# Patient Record
Sex: Female | Born: 1985 | Race: White | Hispanic: No | Marital: Single | Smoking: Former smoker
Health system: Southern US, Community
[De-identification: ages and names within clinical notes are randomized; demographics above are authoritative.]

## PROBLEM LIST (undated history)

## (undated) ENCOUNTER — Inpatient Hospital Stay (HOSPITAL_COMMUNITY): Payer: Self-pay

## (undated) DIAGNOSIS — N83209 Unspecified ovarian cyst, unspecified side: Secondary | ICD-10-CM

## (undated) DIAGNOSIS — N200 Calculus of kidney: Secondary | ICD-10-CM

## (undated) DIAGNOSIS — N390 Urinary tract infection, site not specified: Secondary | ICD-10-CM

## (undated) DIAGNOSIS — F419 Anxiety disorder, unspecified: Secondary | ICD-10-CM

## (undated) HISTORY — PX: NO PAST SURGERIES: SHX2092

## (undated) HISTORY — PX: TYMPANOSTOMY TUBE PLACEMENT: SHX32

---

## 2006-12-08 ENCOUNTER — Inpatient Hospital Stay (HOSPITAL_COMMUNITY): Admission: AD | Admit: 2006-12-08 | Discharge: 2006-12-08 | Payer: Self-pay | Admitting: Obstetrics and Gynecology

## 2007-05-22 ENCOUNTER — Inpatient Hospital Stay (HOSPITAL_COMMUNITY): Admission: AD | Admit: 2007-05-22 | Discharge: 2007-05-24 | Payer: Self-pay | Admitting: Obstetrics and Gynecology

## 2007-05-22 ENCOUNTER — Inpatient Hospital Stay (HOSPITAL_COMMUNITY): Admission: AD | Admit: 2007-05-22 | Discharge: 2007-05-22 | Payer: Self-pay | Admitting: Obstetrics and Gynecology

## 2009-02-16 ENCOUNTER — Emergency Department (HOSPITAL_BASED_OUTPATIENT_CLINIC_OR_DEPARTMENT_OTHER): Admission: EM | Admit: 2009-02-16 | Discharge: 2009-02-16 | Payer: Self-pay | Admitting: Emergency Medicine

## 2011-03-28 ENCOUNTER — Emergency Department (HOSPITAL_BASED_OUTPATIENT_CLINIC_OR_DEPARTMENT_OTHER)
Admission: EM | Admit: 2011-03-28 | Discharge: 2011-03-28 | Disposition: A | Discharge status: Home / Self Care | Payer: Medicaid Other | Attending: Emergency Medicine | Admitting: Emergency Medicine

## 2011-03-28 DIAGNOSIS — S335XXA Sprain of ligaments of lumbar spine, initial encounter: Secondary | ICD-10-CM | POA: Insufficient documentation

## 2011-03-28 DIAGNOSIS — X500XXA Overexertion from strenuous movement or load, initial encounter: Secondary | ICD-10-CM | POA: Insufficient documentation

## 2011-03-28 DIAGNOSIS — F17200 Nicotine dependence, unspecified, uncomplicated: Secondary | ICD-10-CM | POA: Insufficient documentation

## 2011-03-28 LAB — URINALYSIS, ROUTINE W REFLEX MICROSCOPIC
Glucose, UA: NEGATIVE mg/dL
Ketones, ur: NEGATIVE mg/dL
Protein, ur: NEGATIVE mg/dL
Specific Gravity, Urine: 1.009 (ref 1.005–1.030)
Urobilinogen, UA: 0.2 mg/dL (ref 0.0–1.0)
pH: 6.5 (ref 5.0–8.0)

## 2011-04-07 NOTE — Discharge Summary (Signed)
NAMEIRAN, KIEVIT NO.:  [id number]   MEDICAL RECORD NO.:  [id number]          PATIENT TYPE:  INP   LOCATION:  9108                          FACILITY:  [facility]   PHYSICIAN:  Huel Cote, M.D. DATE OF BIRTH:  Apr 09, 1986   DATE OF ADMISSION:  05/22/2007  DATE OF DISCHARGE:  05/24/2007                               DISCHARGE SUMMARY   DISCHARGE DIAGNOSES:  1. Term pregnancy at 38+ weeks, delivered.  2. Status post normal spontaneous vaginal delivery.   DISCHARGE MEDICATIONS:  1. Motrin 600 mg p.o. q.6h.  2. Percocet 1-2 tabs p.o. q.4h. p.r.n.   DISCHARGE FOLLOWUP:  The patient is to follow up in my office in 6 weeks  for a routine postpartum exam.   HOSPITAL COURSE:  The patient is a 25 year old G1 P0, who was admitted  at 38+ weeks gestation in active labor with cervical change to 4-5 cm.  Her prenatal care had been complicated by a history of migraines and  panic attacks, but that have been stable throughout the pregnancy.   PRENATAL LABS:  A positive, antibody negative, RPR nonreactive, rubella  immune, hepatitis B surface antigen negative, HIV declined, GC negative,  Chlamydia negative, quad screen negative, 1 hour Glucola 108.   PAST OB HISTORY:  None.   PAST GYN HISTORY:  No abnormal Pap smears.   PAST MEDICAL HISTORY:  Panic attacks and migraines as stated.   PAST SURGICAL HISTORY:  None.   No known drug allergies.   On admission, she was afebrile with stable vital signs.  Fetal heart  rate was reactive.  Cervix was 80 and 5-6 and -1 station.  She had  rupture of membranes performed with clear fluid noted.  Approximately 2  hours thereafter, she really had not changed much and therefore was  augmented with Pitocin.  She progressed then quickly to complete  dilation and pushed well for approximately 45 minutes with a normal  spontaneous vaginal delivery of a vigorous female infant over an intact  perineum.  Apgars were 8 and 9.  Weight was 6  pounds 15 ounces.  There  was nuchal cord x1 delivered through, placenta delivered spontaneously.  Cervix, rectum, and vagina were intact, and estimated blood loss was 350  mL.  She was then admitted for routine postpartum care.  On postpartum  day #1, she was doing  quite well.  Her pain was well-controlled with Motrin and Percocet.  Her  bleeding was normal, and she requested early discharge home.  This was  granted as long as the baby was able to be discharged, and discharge  instructions were reviewed with her, and she was instructed to follow up  in 6 weeks.      Huel Cote, M.D.  Electronically Signed     KR/MEDQ  D:  05/24/2007  T:  05/24/2007  Job:  [id number]

## 2011-04-10 ENCOUNTER — Emergency Department (INDEPENDENT_AMBULATORY_CARE_PROVIDER_SITE_OTHER): Payer: Medicaid Other

## 2011-04-10 ENCOUNTER — Emergency Department (HOSPITAL_BASED_OUTPATIENT_CLINIC_OR_DEPARTMENT_OTHER)
Admission: EM | Admit: 2011-04-10 | Discharge: 2011-04-10 | Disposition: A | Discharge status: Home / Self Care | Payer: Medicaid Other | Attending: Emergency Medicine | Admitting: Emergency Medicine

## 2011-04-10 DIAGNOSIS — R109 Unspecified abdominal pain: Secondary | ICD-10-CM

## 2011-04-10 DIAGNOSIS — K5900 Constipation, unspecified: Secondary | ICD-10-CM | POA: Insufficient documentation

## 2011-04-10 DIAGNOSIS — Z87442 Personal history of urinary calculi: Secondary | ICD-10-CM

## 2011-04-10 LAB — URINALYSIS, ROUTINE W REFLEX MICROSCOPIC
Glucose, UA: NEGATIVE mg/dL
Nitrite: NEGATIVE
Protein, ur: NEGATIVE mg/dL
Urobilinogen, UA: 0.2 mg/dL (ref 0.0–1.0)

## 2011-04-10 LAB — DIFFERENTIAL
Lymphocytes Relative: 36 % (ref 12–46)
Lymphs Abs: 2.2 10*3/uL (ref 0.7–4.0)
Monocytes Relative: 9 % (ref 3–12)
Neutro Abs: 3.2 10*3/uL (ref 1.7–7.7)
Neutrophils Relative %: 53 % (ref 43–77)

## 2011-04-10 LAB — COMPREHENSIVE METABOLIC PANEL
ALT: 14 U/L (ref 0–35)
Albumin: 4.4 g/dL (ref 3.5–5.2)
Alkaline Phosphatase: 50 U/L (ref 39–117)
Calcium: 9.7 mg/dL (ref 8.4–10.5)
GFR calc non Af Amer: >60 mL/min (ref >60)
Sodium: 137 mEq/L (ref 135–145)
Total Bilirubin: 1.6 mg/dL — ABNORMAL HIGH (ref 0.3–1.2)
Total Protein: 7.3 g/dL (ref 6.0–8.3)

## 2011-04-10 LAB — CBC
HCT: 37.7 % (ref 36.0–46.0)
MCH: 29.9 pg (ref 26.0–34.0)
MCV: 86.1 fL (ref 78.0–100.0)
RBC: 4.38 MIL/uL (ref 3.87–5.11)

## 2011-09-08 LAB — CBC
Hemoglobin: 9.8 — ABNORMAL LOW
MCHC: 33.6
MCV: 85.2
RBC: 3.41 — ABNORMAL LOW
WBC: 8.8

## 2011-09-09 LAB — RAPID HIV SCREEN (WH-MAU): Rapid HIV Screen: NONREACTIVE

## 2011-09-09 LAB — CBC
Hemoglobin: 11.6 — ABNORMAL LOW
MCHC: 33.5
MCV: 84.2
Platelets: 217

## 2011-12-15 ENCOUNTER — Emergency Department (HOSPITAL_BASED_OUTPATIENT_CLINIC_OR_DEPARTMENT_OTHER)
Admission: EM | Admit: 2011-12-15 | Discharge: 2011-12-15 | Disposition: A | Discharge status: Home / Self Care | Payer: Medicaid Other | Attending: Emergency Medicine | Admitting: Emergency Medicine

## 2011-12-15 ENCOUNTER — Encounter (HOSPITAL_BASED_OUTPATIENT_CLINIC_OR_DEPARTMENT_OTHER): Payer: Self-pay | Admitting: *Deleted

## 2011-12-15 DIAGNOSIS — R05 Cough: Secondary | ICD-10-CM | POA: Insufficient documentation

## 2011-12-15 DIAGNOSIS — J069 Acute upper respiratory infection, unspecified: Secondary | ICD-10-CM | POA: Insufficient documentation

## 2011-12-15 DIAGNOSIS — J029 Acute pharyngitis, unspecified: Secondary | ICD-10-CM

## 2011-12-15 DIAGNOSIS — R059 Cough, unspecified: Secondary | ICD-10-CM | POA: Insufficient documentation

## 2011-12-15 HISTORY — DX: Unspecified ovarian cyst, unspecified side: N83.209

## 2011-12-15 HISTORY — DX: Anxiety disorder, unspecified: F41.9

## 2011-12-15 HISTORY — DX: Urinary tract infection, site not specified: N39.0

## 2011-12-15 MED ORDER — DEXAMETHASONE SODIUM PHOSPHATE 10 MG/ML IJ SOLN
INTRAMUSCULAR | Status: AC
  Filled 2011-12-15: qty 1

## 2011-12-15 MED ORDER — NAPROXEN 500 MG PO TABS: 500.0000 mg | ORAL_TABLET | Freq: Two times a day (BID) | ORAL | Status: AC

## 2011-12-15 MED ORDER — DEXAMETHASONE 10 MG/ML FOR PEDIATRIC ORAL USE
10.0000 mg | Freq: Once | INTRAMUSCULAR | Status: AC
  Administered 2011-12-15: 10 mg via ORAL
  Filled 2011-12-15: qty 1

## 2011-12-15 NOTE — ED Notes (Signed)
Patient states she has had a sore throat for the last 5 days, which is associated with chills, hot flashes, fatigue and a nonproductive cough.

## 2011-12-15 NOTE — ED Provider Notes (Signed)
History     CSN: [id number]  Arrival date & time 12/15/11  4098   First MD Initiated Contact with Patient 12/15/11 1027      Chief Complaint  Patient presents with  . Sore Throat    (Consider location/radiation/quality/duration/timing/severity/associated sxs/prior treatment) Patient is a 26 y.o. female presenting with pharyngitis. The history is provided by the patient. No language interpreter was used.  Sore Throat This is a new problem. The current episode started more than 2 days ago (5 days ago). The problem occurs constantly. The problem has been gradually worsening. Pertinent negatives include no chest pain, no abdominal pain, no headaches and no shortness of breath. The symptoms are aggravated by swallowing. The symptoms are relieved by nothing. She has tried nothing for the symptoms.    Past Medical History  Diagnosis Date  . Anxiety   . Ovarian cyst   . UTI (lower urinary tract infection)     History reviewed. No pertinent past surgical history.  No family history on file.  History  Substance Use Topics  . Smoking status: Former Games developer  . Smokeless tobacco: Never Used  . Alcohol Use: Yes     occassionally    OB History    Grav Para Term Preterm Abortions TAB SAB Ect Mult Living                  Review of Systems  Constitutional: Negative for chills, activity change, appetite change and fatigue.  HENT: Positive for congestion, sore throat and rhinorrhea. Negative for trouble swallowing, neck pain and neck stiffness.   Respiratory: Positive for cough. Negative for shortness of breath.   Cardiovascular: Negative for chest pain and palpitations.  Gastrointestinal: Negative for nausea, vomiting and abdominal pain.  Genitourinary: Negative for dysuria, urgency, frequency and flank pain.  Neurological: Negative for dizziness, weakness, light-headedness, numbness and headaches.  All other systems reviewed and are negative.    Allergies  Review of patient's  allergies indicates no known allergies.  Home Medications   Current Outpatient Rx  Name Route Sig Dispense Refill  . NAPROXEN 500 MG PO TABS Oral Take 1 tablet (500 mg total) by mouth 2 (two) times daily. 30 tablet 0    BP 109/66  Pulse 77  Temp(Src) 98.6 F (37 C) (Oral)  Resp 18  Ht 5\' 6"  (1.676 m)  Wt 118 lb (53.524 kg)  BMI 19.05 kg/m2  SpO2 100%  LMP 12/01/2011  Physical Exam  Nursing note and vitals reviewed. Constitutional: She appears well-developed and well-nourished. No distress.  HENT:  Head: Normocephalic and atraumatic.  Mouth/Throat: No oropharyngeal exudate.       Mild oropharyngeal erythema  Eyes: Conjunctivae and EOM are normal. Pupils are equal, round, and reactive to light.  Neck: Normal range of motion. Neck supple.  Cardiovascular: Normal rate, regular rhythm, normal heart sounds and intact distal pulses.  Exam reveals no gallop and no friction rub.   No murmur heard. Pulmonary/Chest: Effort normal and breath sounds normal. No respiratory distress.  Abdominal: Soft. Bowel sounds are normal. There is no tenderness.  Musculoskeletal: Normal range of motion. She exhibits no tenderness.  Lymphadenopathy:    She has no cervical adenopathy.  Neurological: She is alert. No cranial nerve deficit.  Skin: Skin is warm and dry. No rash noted.    ED Course  Procedures (including critical care time)   Labs Reviewed  RAPID STREP SCREEN   No results found.   1. URI (upper respiratory infection)   2.  Pharyngitis       MDM  Rapid strep negative. I feel her pharyngitis is secondary to her upper respiratory infection. She was given a dose of Decadron the emergency department. I encouraged to the symptoms are noted throughout its course it is secondary to viral illness. She's instructed to followup with her primary care physician as needed        Dayton Bailiff, MD 12/15/11 1048

## 2012-04-02 ENCOUNTER — Encounter (HOSPITAL_BASED_OUTPATIENT_CLINIC_OR_DEPARTMENT_OTHER): Payer: Self-pay | Admitting: *Deleted

## 2012-04-02 ENCOUNTER — Emergency Department (HOSPITAL_BASED_OUTPATIENT_CLINIC_OR_DEPARTMENT_OTHER)
Admission: EM | Admit: 2012-04-02 | Discharge: 2012-04-02 | Disposition: A | Discharge status: Home / Self Care | Payer: Medicaid Other | Attending: Emergency Medicine | Admitting: Emergency Medicine

## 2012-04-02 ENCOUNTER — Emergency Department (INDEPENDENT_AMBULATORY_CARE_PROVIDER_SITE_OTHER): Payer: Medicaid Other

## 2012-04-02 DIAGNOSIS — F411 Generalized anxiety disorder: Secondary | ICD-10-CM

## 2012-04-02 DIAGNOSIS — R42 Dizziness and giddiness: Secondary | ICD-10-CM | POA: Insufficient documentation

## 2012-04-02 DIAGNOSIS — R51 Headache: Secondary | ICD-10-CM

## 2012-04-02 LAB — URINALYSIS, ROUTINE W REFLEX MICROSCOPIC
Glucose, UA: NEGATIVE mg/dL
Ketones, ur: NEGATIVE mg/dL
Nitrite: NEGATIVE
pH: 6.5 (ref 5.0–8.0)

## 2012-04-02 LAB — DIFFERENTIAL
Basophils Absolute: 0 10*3/uL (ref 0.0–0.1)
Basophils Relative: 0 % (ref 0–1)
Eosinophils Relative: 2 % (ref 0–5)
Monocytes Absolute: 0.5 10*3/uL (ref 0.1–1.0)
Monocytes Relative: 9 % (ref 3–12)

## 2012-04-02 LAB — CBC
MCHC: 35 g/dL (ref 30.0–36.0)
Platelets: 230 10*3/uL (ref 150–400)
RDW: 11.8 % (ref 11.5–15.5)

## 2012-04-02 LAB — BASIC METABOLIC PANEL
BUN: 16 mg/dL (ref 6–23)
CO2: 29 mEq/L (ref 19–32)
Calcium: 9.8 mg/dL (ref 8.4–10.5)
Creatinine, Ser: 0.8 mg/dL (ref 0.50–1.10)
GFR calc Af Amer: >90 mL/min (ref >90)

## 2012-04-02 LAB — URINE MICROSCOPIC-ADD ON

## 2012-04-02 MED ORDER — SODIUM CHLORIDE 0.9 % IV BOLUS (SEPSIS)
1000.0000 mL | Freq: Once | INTRAVENOUS | Status: AC
  Administered 2012-04-02: 1000 mL via INTRAVENOUS

## 2012-04-02 MED ORDER — DIPHENHYDRAMINE HCL 50 MG/ML IJ SOLN
25.0000 mg | Freq: Once | INTRAMUSCULAR | Status: AC
  Administered 2012-04-02: 25 mg via INTRAVENOUS
  Filled 2012-04-02: qty 1

## 2012-04-02 MED ORDER — KETOROLAC TROMETHAMINE 30 MG/ML IJ SOLN
30.0000 mg | Freq: Once | INTRAMUSCULAR | Status: AC
  Administered 2012-04-02: 30 mg via INTRAVENOUS
  Filled 2012-04-02: qty 1

## 2012-04-02 MED ORDER — PROCHLORPERAZINE EDISYLATE 5 MG/ML IJ SOLN
10.0000 mg | Freq: Four times a day (QID) | INTRAMUSCULAR | Status: DC | PRN
Start: 2012-04-02 — End: 2012-04-02
  Filled 2012-04-02: qty 2

## 2012-04-02 MED ORDER — METHYLPREDNISOLONE SODIUM SUCC 125 MG IJ SOLR
125.0000 mg | Freq: Once | INTRAMUSCULAR | Status: AC
  Administered 2012-04-02: 125 mg via INTRAVENOUS
  Filled 2012-04-02: qty 2

## 2012-04-02 NOTE — ED Provider Notes (Signed)
History     CSN: [id number]  Arrival date & time 04/02/12  1410   First MD Initiated Contact with Patient 04/02/12 1509      Chief Complaint  Patient presents with  . Headache    (Consider location/radiation/quality/duration/timing/severity/associated sxs/prior treatment) Patient is a 26 y.o. female presenting with headaches. The history is provided by the patient. No language interpreter was used.  Headache    Kelsey Day is a 26 y.o. female who presents to the Emergency Department complaining of a moderate to severe, constant HA onset 2-3 days ago associated with a syncopic episode and lightheadedness. Patient claims that she gets migraines regularly that usually begin gradually. She has never been treated for them. Patient says that she usually feels pain in the occipital region of the head. Patient says the pain worsens when lights are too bright. Patient describes the headaches as sharp. Patient reports that she gets them 2-3 times per month and uses Goody's powder with no relief. Patient also took an Ibuprofen today. Patient has a current Mirena prescription. Patient denies hematuria, dysuria or excessive urination. Patient has no history of other chronic illness and doesn't take any regular medications.    Past Medical History  Diagnosis Date  . Anxiety   . Ovarian cyst   . UTI (lower urinary tract infection)     History reviewed. No pertinent past surgical history.  History reviewed. No pertinent family history.  History  Substance Use Topics  . Smoking status: Former Games developer  . Smokeless tobacco: Never Used  . Alcohol Use: Yes     occassionally    OB History    Grav Para Term Preterm Abortions TAB SAB Ect Mult Living                  Review of Systems  Neurological: Positive for headaches.  A complete 10 system review of systems was obtained and all systems are negative except as noted in the HPI, ROS and PMH.    Allergies  Review of patient's  allergies indicates no known allergies.  Home Medications   Current Outpatient Rx  Name Route Sig Dispense Refill  . ALPRAZOLAM 0.25 MG PO TABS Oral Take 0.25 mg by mouth at bedtime as needed.    Marlin Canary HEADACHE PO Oral Take 1 packet by mouth daily as needed. Patient used this medication for her headache.    Marland Kitchen NAPROXEN 500 MG PO TABS Oral Take 1 tablet (500 mg total) by mouth 2 (two) times daily. 30 tablet 0    BP 104/62  Pulse 78  Temp(Src) 98.2 F (36.8 C) (Oral)  Resp 20  Ht 5\' 2"  (1.575 m)  Wt 125 lb (56.7 kg)  BMI 22.86 kg/m2  SpO2 98%  LMP 03/03/2012  Physical Exam  Nursing note and vitals reviewed. Constitutional: She is oriented to person, place, and time. She appears well-developed and well-nourished.  HENT:  Head: Normocephalic and atraumatic.  Eyes: Conjunctivae and EOM are normal. Pupils are equal, round, and reactive to light.  Neck: Normal range of motion. Neck supple.  Cardiovascular: Normal rate and regular rhythm.   Pulmonary/Chest: Effort normal and breath sounds normal.  Abdominal: Soft. Bowel sounds are normal.  Musculoskeletal: Normal range of motion.  Neurological: She is alert and oriented to person, place, and time.  Skin: Skin is warm and dry.  Psychiatric: She has a normal mood and affect.    ED Course  Procedures (including critical care time) DIAGNOSTIC STUDIES: Oxygen Saturation is  98% on room air, normal by my interpretation.    COORDINATION OF CARE: 3:34PM- Patient informed of current plan for treatment and evaluation and agrees with plan at this time. Will order labs and administer pain medications for HA.   Labs Reviewed  URINALYSIS, ROUTINE W REFLEX MICROSCOPIC - Abnormal; Notable for the following:    Leukocytes, UA TRACE (*)    All other components within normal limits  URINE MICROSCOPIC-ADD ON - Abnormal; Notable for the following:    Squamous Epithelial / LPF FEW (*)    Bacteria, UA MANY (*)    All other components within  normal limits  PREGNANCY, URINE  CBC  DIFFERENTIAL  BASIC METABOLIC PANEL   Ct Head Wo Contrast  04/02/2012  *RADIOLOGY REPORT*  Clinical Data: Headache, anxiety  CT HEAD WITHOUT CONTRAST  Technique:  Contiguous axial images were obtained from the base of the skull through the vertex without contrast.  Comparison: None.  Findings: No acute intracranial hemorrhage.  No focal mass lesion. No CT evidence of acute infarction.   No midline shift or mass effect.  No hydrocephalus.  Basilar cisterns are patent. Paranasal sinuses and mastoid air cells are clear.  Orbits are normal.  IMPRESSION: Normal head CT  Original Report Authenticated By: Genevive Bi, M.D.     No diagnosis found.    MDM  Patient with headache c.w. Migrain.  Patient treated here with benadryl, solumedrol, and toradol with improvement of symptoms.  CT without acute abnormality. Urine with bacteria and le and epithelial cells - likely contaminated as no uti symptoms.   I personally performed the services described in this documentation, which was scribed in my presence. The recorded information has been reviewed and considered.       Hilario Quarry, MD 04/02/12 [phone]

## 2012-04-02 NOTE — ED Notes (Signed)
Pt states son recently diagnosed with a ":virus". Similar symptoms. Pt describes 2 days of feeling light-headed. Near syncope yesterday at work. Feel faint. Also c/o abd pain and H/A. PERL. Ambulatory without difficulty. Husband reports feeling light-headed as well.

## 2012-04-02 NOTE — Discharge Instructions (Signed)

## 2012-12-27 ENCOUNTER — Emergency Department (HOSPITAL_BASED_OUTPATIENT_CLINIC_OR_DEPARTMENT_OTHER)
Admission: EM | Admit: 2012-12-27 | Discharge: 2012-12-27 | Disposition: A | Discharge status: Home / Self Care | Payer: Medicaid Other | Attending: Emergency Medicine | Admitting: Emergency Medicine

## 2012-12-27 ENCOUNTER — Emergency Department (HOSPITAL_BASED_OUTPATIENT_CLINIC_OR_DEPARTMENT_OTHER): Payer: Medicaid Other

## 2012-12-27 ENCOUNTER — Encounter (HOSPITAL_BASED_OUTPATIENT_CLINIC_OR_DEPARTMENT_OTHER): Payer: Self-pay

## 2012-12-27 ENCOUNTER — Emergency Department (HOSPITAL_BASED_OUTPATIENT_CLINIC_OR_DEPARTMENT_OTHER): Admission: EM | Admit: 2012-12-27 | Payer: Medicaid Other | Source: Home / Self Care

## 2012-12-27 DIAGNOSIS — Z8742 Personal history of other diseases of the female genital tract: Secondary | ICD-10-CM | POA: Insufficient documentation

## 2012-12-27 DIAGNOSIS — Z3202 Encounter for pregnancy test, result negative: Secondary | ICD-10-CM | POA: Insufficient documentation

## 2012-12-27 DIAGNOSIS — M549 Dorsalgia, unspecified: Secondary | ICD-10-CM | POA: Insufficient documentation

## 2012-12-27 DIAGNOSIS — R109 Unspecified abdominal pain: Secondary | ICD-10-CM | POA: Insufficient documentation

## 2012-12-27 DIAGNOSIS — Z8659 Personal history of other mental and behavioral disorders: Secondary | ICD-10-CM | POA: Insufficient documentation

## 2012-12-27 DIAGNOSIS — Z87440 Personal history of urinary (tract) infections: Secondary | ICD-10-CM | POA: Insufficient documentation

## 2012-12-27 LAB — CBC WITH DIFFERENTIAL/PLATELET
Eosinophils Relative: 3 % (ref 0–5)
Hemoglobin: 13.8 g/dL (ref 12.0–15.0)
Lymphocytes Relative: 40 % (ref 12–46)
Lymphs Abs: 2 10*3/uL (ref 0.7–4.0)
MCV: 88.1 fL (ref 78.0–100.0)
Platelets: 220 10*3/uL (ref 150–400)
RBC: 4.52 MIL/uL (ref 3.87–5.11)
WBC: 4.9 10*3/uL (ref 4.0–10.5)

## 2012-12-27 LAB — COMPREHENSIVE METABOLIC PANEL
ALT: 22 U/L (ref 0–35)
AST: 16 U/L (ref 0–37)
Albumin: 3.9 g/dL (ref 3.5–5.2)
Calcium: 9.6 mg/dL (ref 8.4–10.5)
Sodium: 140 mEq/L (ref 135–145)
Total Protein: 7 g/dL (ref 6.0–8.3)

## 2012-12-27 LAB — URINALYSIS, ROUTINE W REFLEX MICROSCOPIC
Glucose, UA: NEGATIVE mg/dL
Specific Gravity, Urine: 1.027 (ref 1.005–1.030)
Urobilinogen, UA: 1 mg/dL (ref 0.0–1.0)
pH: 6.5 (ref 5.0–8.0)

## 2012-12-27 LAB — PREGNANCY, URINE: Preg Test, Ur: NEGATIVE

## 2012-12-27 LAB — URINE MICROSCOPIC-ADD ON

## 2012-12-27 MED ORDER — ONDANSETRON HCL 4 MG/2ML IJ SOLN
4.0000 mg | Freq: Once | INTRAMUSCULAR | Status: AC
  Administered 2012-12-27: 4 mg via INTRAVENOUS
  Filled 2012-12-27: qty 2

## 2012-12-27 MED ORDER — HYDROMORPHONE HCL PF 1 MG/ML IJ SOLN
1.0000 mg | Freq: Once | INTRAMUSCULAR | Status: AC
  Administered 2012-12-27: 1 mg via INTRAVENOUS
  Filled 2012-12-27: qty 1

## 2012-12-27 MED ORDER — SODIUM CHLORIDE 0.9 % IV SOLN
1000.0000 mL | INTRAVENOUS | Status: DC
  Administered 2012-12-27: 1000 mL via INTRAVENOUS

## 2012-12-27 MED ORDER — HYDROCODONE-ACETAMINOPHEN 5-325 MG PO TABS: 1.0000 | ORAL_TABLET | Freq: Four times a day (QID) | ORAL | Status: DC | PRN

## 2012-12-27 MED ORDER — SODIUM CHLORIDE 0.9 % IV SOLN
1000.0000 mL | Freq: Once | INTRAVENOUS | Status: AC
  Administered 2012-12-27: 1000 mL via INTRAVENOUS

## 2012-12-27 MED ORDER — NAPROXEN 500 MG PO TABS: 500.0000 mg | ORAL_TABLET | Freq: Two times a day (BID) | ORAL | Status: DC

## 2012-12-27 NOTE — ED Provider Notes (Signed)
History    CSN: [id number] Arrival date & time 12/27/12  1513First MD Initiated Contact with Patient 12/27/12 1540      Chief Complaint  Patient presents with  . Abdominal Pain    Patient is a 27 y.o. female presenting with abdominal pain. The history is provided by the patient.  Abdominal Pain The primary symptoms of the illness include abdominal pain. The primary symptoms of the illness do not include fever, fatigue, vomiting, diarrhea, dysuria or vaginal discharge. Episode onset: pt had abominal pain starting last night, today she felt it get acutely mores severe, she thought something  burst when it acutely became more severe today.  The abdominal pain is located in the left flank. The abdominal pain does not radiate. The severity of the abdominal pain is 10/10. The abdominal pain is relieved by nothing.  The patient has not had a change in bowel habit. Additional symptoms associated with the illness include back pain. Symptoms associated with the illness do not include chills, anorexia, diaphoresis, constipation, urgency, hematuria or frequency.    History reviewed. No pertinent past medical history.  History reviewed. No pertinent past surgical history.  No family history on file.  History  Substance Use Topics  . Smoking status: Not on file  . Smokeless tobacco: Not on file  . Alcohol Use: Not on file    OB History    Grav Para Term Preterm Abortions TAB SAB Ect Mult Living                  Review of Systems  Constitutional: Negative for fever, chills, diaphoresis and fatigue.  Gastrointestinal: Positive for abdominal pain. Negative for vomiting, diarrhea, constipation and anorexia.  Genitourinary: Negative for dysuria, urgency, frequency, hematuria and vaginal discharge.  Musculoskeletal: Positive for back pain.  All other systems reviewed and are negative.    Allergies  Review of patient's allergies indicates no known allergies.  Home Medications  No current  outpatient prescriptions on file.  BP 120/84  Pulse 76  Temp 98 F (36.7 C) (Oral)  Resp 20  SpO2 96%  LMP 12/26/2012  Physical Exam  Nursing note and vitals reviewed. Constitutional: She appears well-developed and well-nourished. No distress.  HENT:  Head: Normocephalic and atraumatic.  Right Ear: External ear normal.  Left Ear: External ear normal.  Eyes: Conjunctivae normal are normal. Right eye exhibits no discharge. Left eye exhibits no discharge. No scleral icterus.  Neck: Neck supple. No tracheal deviation present.  Cardiovascular: Normal rate, regular rhythm and intact distal pulses.   Pulmonary/Chest: Effort normal and breath sounds normal. No stridor. No respiratory distress. She has no wheezes. She has no rales.  Abdominal: Soft. Bowel sounds are normal. She exhibits no distension. There is tenderness. There is CVA tenderness (left side). There is no rebound and no guarding. No hernia.  Musculoskeletal: She exhibits no edema and no tenderness.  Neurological: She is alert. She has normal strength. No sensory deficit. Cranial nerve deficit:  no gross defecits noted. She exhibits normal muscle tone. She displays no seizure activity. Coordination normal.  Skin: Skin is warm and dry. No rash noted.  Psychiatric: She has a normal mood and affect.    ED Course  Procedures (including critical care time)  Labs Reviewed  COMPREHENSIVE METABOLIC PANEL - Abnormal; Notable for the following:    Total Bilirubin 2.2 (*)     GFR calc non Af Amer 87 (*)     All other components within normal limits  URINALYSIS, ROUTINE  W REFLEX MICROSCOPIC - Abnormal; Notable for the following:    Color, Urine AMBER (*)  BIOCHEMICALS MAY BE AFFECTED BY COLOR   Hgb urine dipstick MODERATE (*)     All other components within normal limits  LIPASE, BLOOD  CBC WITH DIFFERENTIAL  PREGNANCY, URINE  URINE MICROSCOPIC-ADD ON   Ct Abdomen Pelvis Wo Contrast  12/27/2012  *RADIOLOGY REPORT*  Clinical  Data: Left flank pain and hematuria.  CT ABDOMEN AND PELVIS WITHOUT CONTRAST  Technique:  Multidetector CT imaging of the abdomen and pelvis was performed following the standard protocol without intravenous contrast.  Comparison: None  Findings: Lung bases are unremarkable in appearance.  Kidneys show no intrarenal calculi.  There is no hydronephrosis.  Although there are scattered phleboliths, no definite ureteral calculi identified.  No focal abnormality identified within the liver, spleen, pancreas, adrenal glands, or kidneys.  Gallbladder is present.  The stomach and small bowel loops are normal in appearance. The appendix is well seen and has a normal appearance.  Colonic loops are normal in appearance. The appendix is well seen and has a normal appearance.  The uterus is present.  No adnexal mass identified.  There is a small amount free pelvic fluid, likely physiologic. Visualized osseous structures have a normal appearance.  IMPRESSION: No evidence for acute abnormality of the abdomen and pelvis.   Original Report Authenticated By: Norva Pavlov, M.D.      1. Flank pain       MDM  Exam, findings, and CT can are reassuring. Pt with flank pain.  No sign of acute abnormality.  Possibly could be musculoskeletal.  At this time there does not appear to be any evidence of an acute emergency medical condition and the patient appears stable for discharge with appropriate outpatient follow up.         Celene Kras, MD 12/27/12 [phone]

## 2012-12-27 NOTE — ED Notes (Signed)
Pt reports onset of LUQ pain last night.

## 2012-12-29 ENCOUNTER — Encounter (HOSPITAL_BASED_OUTPATIENT_CLINIC_OR_DEPARTMENT_OTHER): Payer: Self-pay | Admitting: *Deleted

## 2013-04-01 ENCOUNTER — Inpatient Hospital Stay (HOSPITAL_COMMUNITY): Payer: Medicaid Other

## 2013-04-01 ENCOUNTER — Encounter (HOSPITAL_COMMUNITY): Payer: Self-pay | Admitting: *Deleted

## 2013-04-01 ENCOUNTER — Inpatient Hospital Stay (HOSPITAL_COMMUNITY)
Admission: AD | Admit: 2013-04-01 | Discharge: 2013-04-01 | Disposition: A | Discharge status: Home / Self Care | Payer: Medicaid Other | Source: Ambulatory Visit | Attending: Obstetrics & Gynecology | Admitting: Obstetrics & Gynecology

## 2013-04-01 DIAGNOSIS — O99891 Other specified diseases and conditions complicating pregnancy: Secondary | ICD-10-CM | POA: Insufficient documentation

## 2013-04-01 DIAGNOSIS — O9989 Other specified diseases and conditions complicating pregnancy, childbirth and the puerperium: Secondary | ICD-10-CM

## 2013-04-01 DIAGNOSIS — R109 Unspecified abdominal pain: Secondary | ICD-10-CM | POA: Insufficient documentation

## 2013-04-01 DIAGNOSIS — N949 Unspecified condition associated with female genital organs and menstrual cycle: Secondary | ICD-10-CM

## 2013-04-01 DIAGNOSIS — O26899 Other specified pregnancy related conditions, unspecified trimester: Secondary | ICD-10-CM

## 2013-04-01 LAB — URINALYSIS, ROUTINE W REFLEX MICROSCOPIC
Bilirubin Urine: NEGATIVE
Glucose, UA: NEGATIVE mg/dL
Ketones, ur: NEGATIVE mg/dL
Leukocytes, UA: NEGATIVE
Protein, ur: NEGATIVE mg/dL
pH: 6 (ref 5.0–8.0)

## 2013-04-01 LAB — WET PREP, GENITAL
Clue Cells Wet Prep HPF POC: NONE SEEN
Trich, Wet Prep: NONE SEEN

## 2013-04-01 NOTE — MAU Note (Signed)
Pt reports she has been having lower abd pain for several weeks. Has not started prenatal care awaiting medicaid approval.

## 2013-04-01 NOTE — MAU Provider Note (Signed)
  History     CSN: [id number]  Arrival date and time: 04/01/13 1238   First Provider Initiated Contact with Patient 04/01/13 1317      Chief Complaint  Patient presents with  . Abdominal Pain   HPI This is a 27 y.o. female at early gestation who presents with c/o lower abdominal cramping for 3 weeks. Unsure LMP.  No bleeding. Last baby 5 yrs ago.   RN Note Pt reports she has been having lower abd pain for several weeks. Has not started prenatal care awaiting medicaid approval  OB History   Grav Para Term Preterm Abortions TAB SAB Ect Mult Living   2 1 1       1       Past Medical History  Diagnosis Date  . Anxiety   . Ovarian cyst   . UTI (lower urinary tract infection)     Past Surgical History  Procedure Laterality Date  . No past surgeries      No family history on file.  History  Substance Use Topics  . Smoking status: Former Smoker    Quit date: 04/02/2007  . Smokeless tobacco: Not on file  . Alcohol Use: Yes     Comment: occassionally/not since pregnancy    Allergies: No Known Allergies  Prescriptions prior to admission  Medication Sig Dispense Refill  . Prenatal Vit-Fe Fumarate-FA (PRENATAL MULTIVITAMIN) TABS Take 1 tablet by mouth daily at 12 noon.        Review of Systems  Constitutional: Negative for fever and weight loss.  Gastrointestinal: Positive for abdominal pain. Negative for nausea, vomiting, diarrhea and constipation.  Neurological: Negative for dizziness, weakness and headaches.   Physical Exam   Blood pressure 100/65, pulse 106, temperature 98.2 F (36.8 C), temperature source Oral, resp. rate 18, height 5\' 6"  (1.676 m), weight 127 lb 3.2 oz (57.698 kg), last menstrual period 01/01/2013.  Physical Exam  Constitutional: She is oriented to person, place, and time. She appears well-developed and well-nourished.  HENT:  Head: Normocephalic.  Cardiovascular: Normal rate.   Respiratory: Effort normal.  GI: Soft.  Genitourinary:  Vagina normal and uterus normal. No vaginal discharge found.  Uterus 8-10 week size, nontender    Musculoskeletal: Normal range of motion.  Neurological: She is alert and oriented to person, place, and time.  Skin: Skin is warm and dry.  Psychiatric: She has a normal mood and affect.   UA and wet prep negative Cultures sent  MAU Course  Procedures  MDM Cultures done. Will check [place] for dates and r/o ovarian cyst   Assessment and Plan  A:  SIUP at unknown gestation per unsure LMP      Dates confirmed at 14.4 weeks      Pelvic pain, could be round ligament   P:  Reviewed results with patient.       Advised to start prenatal care soon   [facility] 04/01/2013, 1:29 PM

## 2013-04-26 IMAGING — CT CT HEAD W/O CM
2 series · 16 of 30 positions shown, 20 images · non-contrast
Comparison: None.

CLINICAL DATA: Headache, anxiety

CT HEAD WITHOUT CONTRAST
TECHNIQUE: Contiguous axial images were obtained from the base of
the skull through the vertex without contrast.

[Series 2: head 4.8 h37s · axial · 0.45mm/px · z∈[-114,+33]mm · 13 of 36 slices shown, 17 images]
[im 3/36  brain]
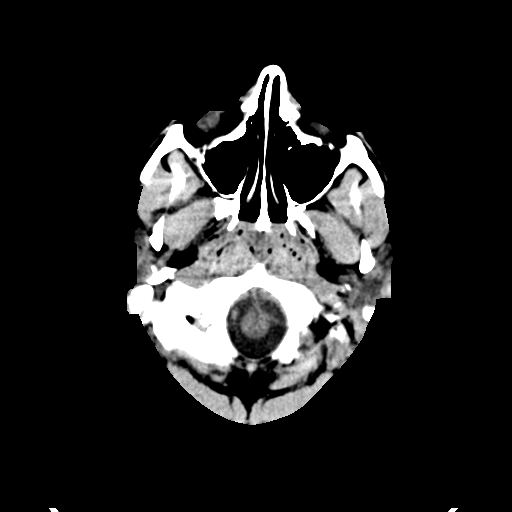
[im 3/36  bone]
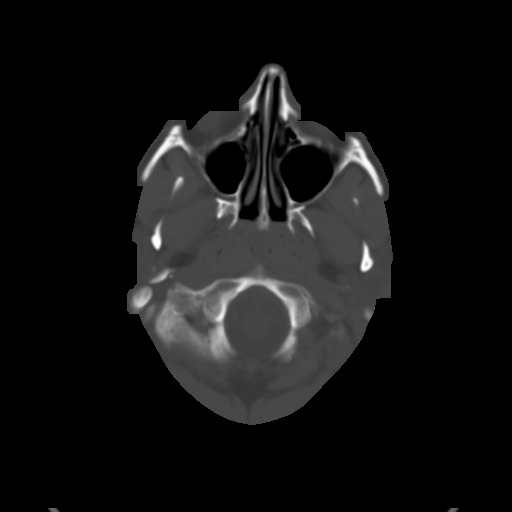
[im 6/36  brain]
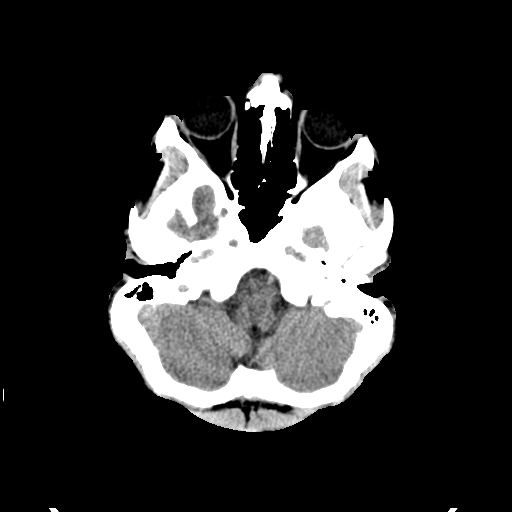
[im 8/36  brain]
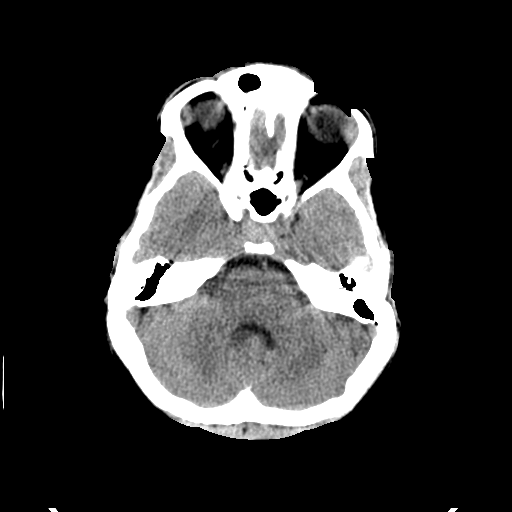
[im 11/36  brain]
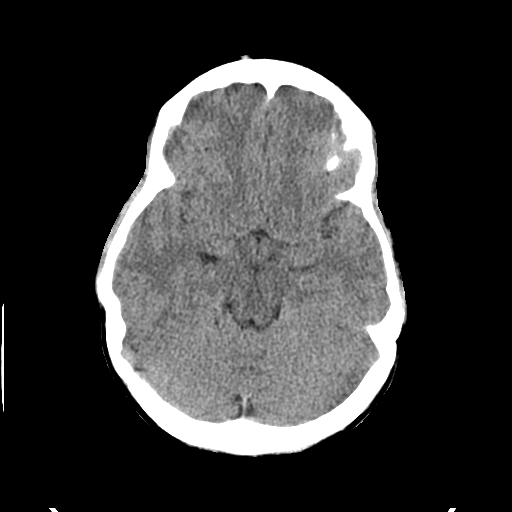
[im 13/36  brain]
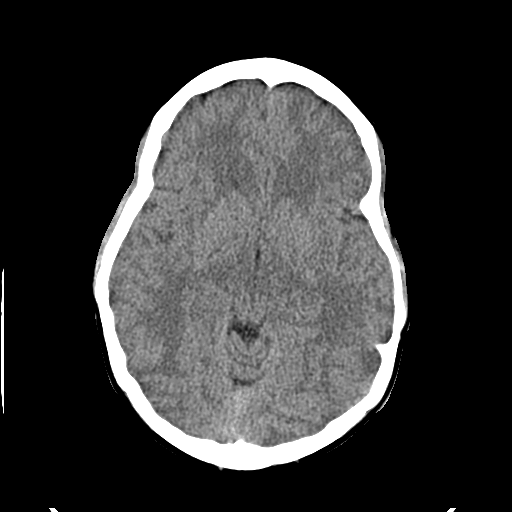
[im 13/36  bone]
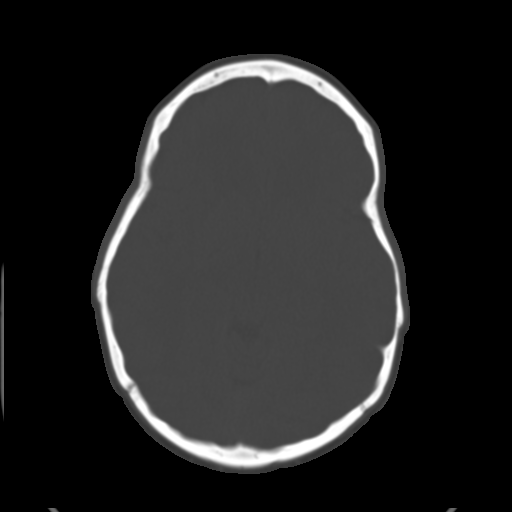
[im 16/36  brain]
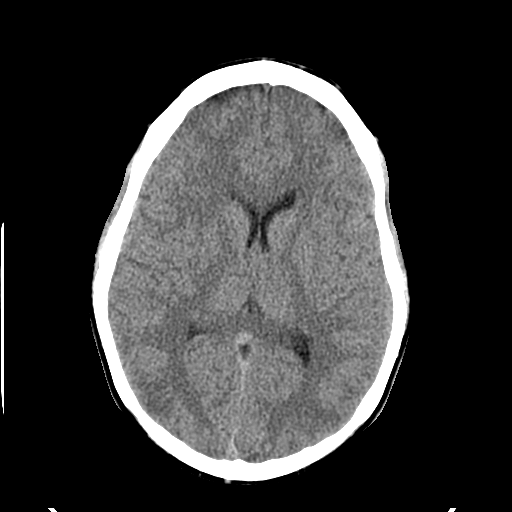
[im 18/36  brain]
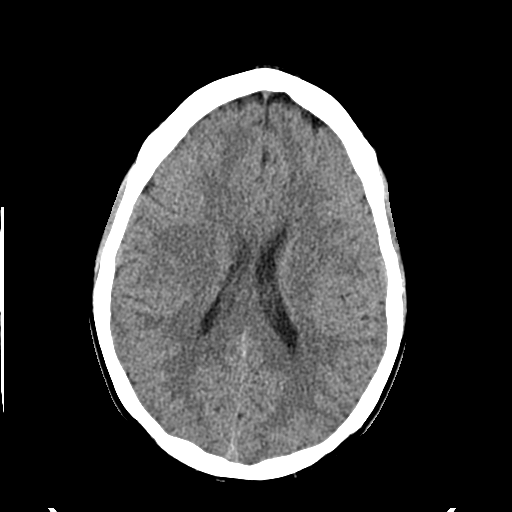
[im 21/36  brain]
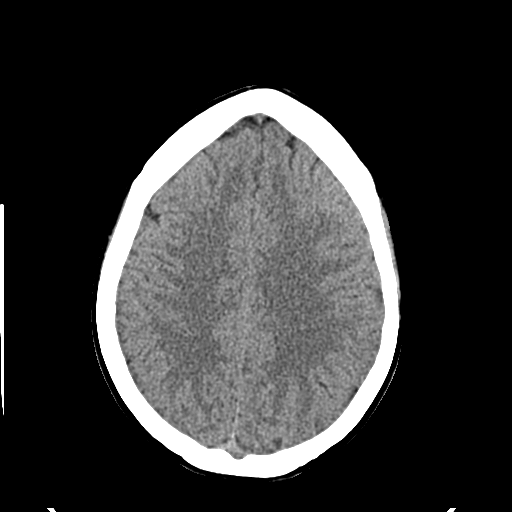
[im 23/36  brain]
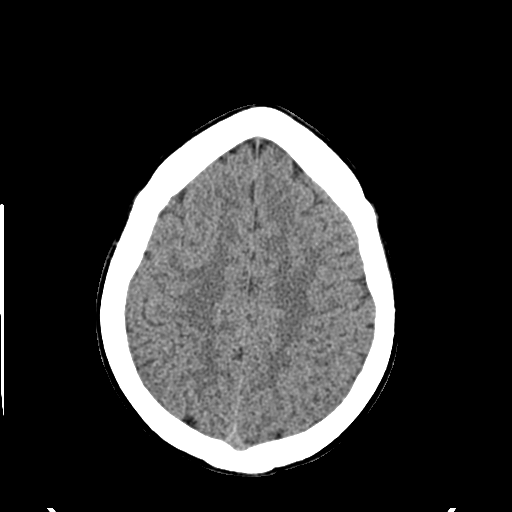
[im 23/36  bone]
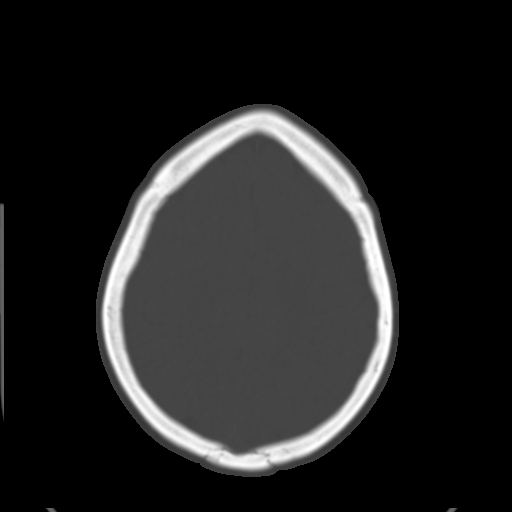
[im 26/36  brain]
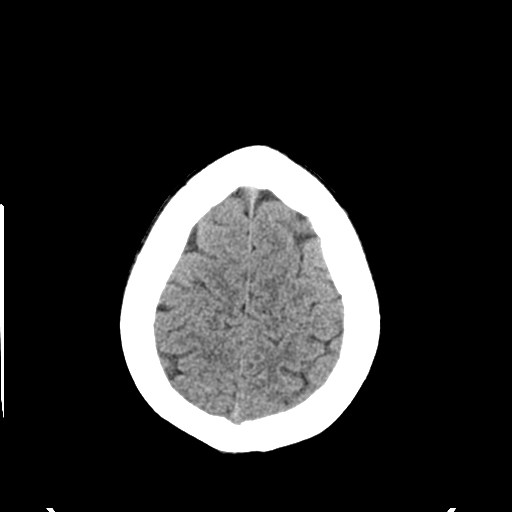
[im 28/36  brain]
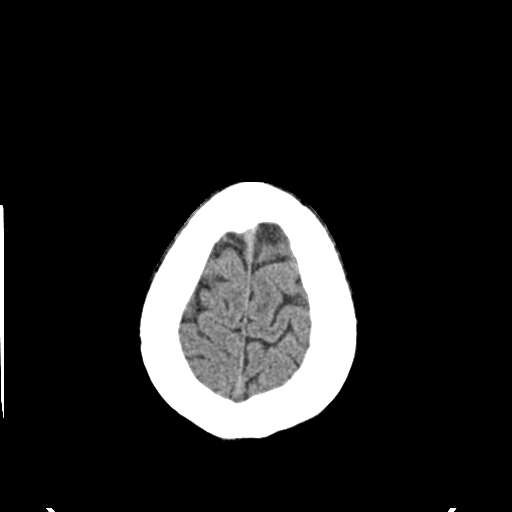
[im 31/36  brain]
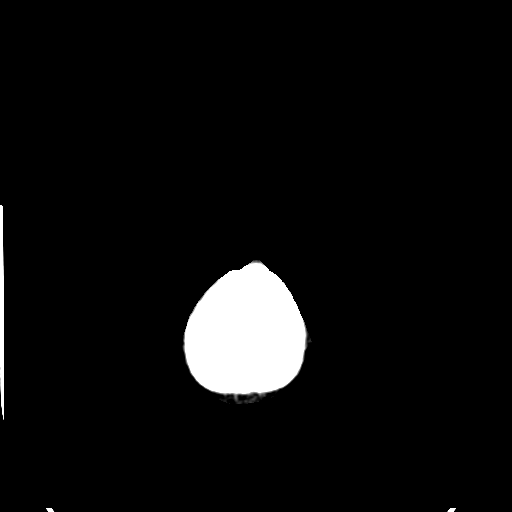
[im 33/36  brain]
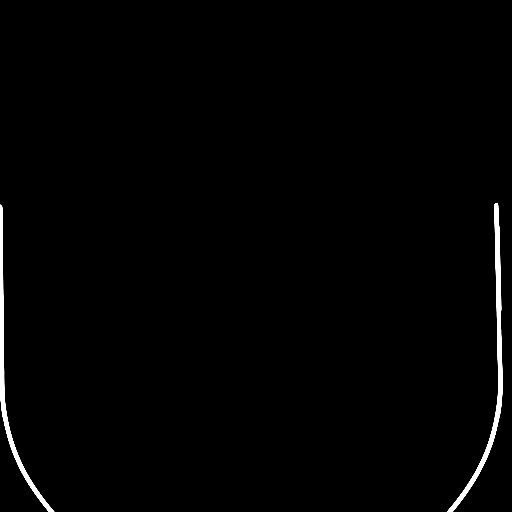
[im 33/36  bone]
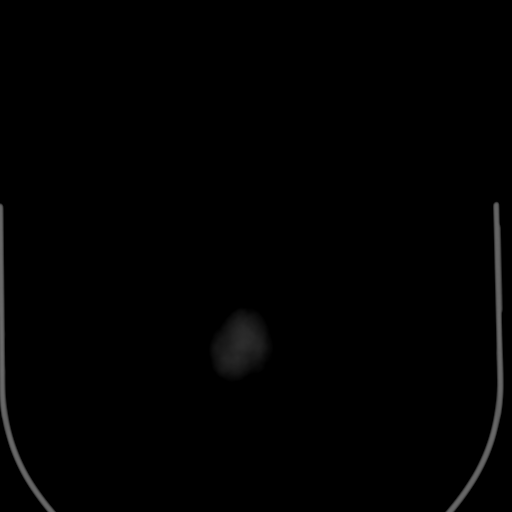

[Series 3: head 4.8 h60s bone · axial · 0.45mm/px · z∈[-114,-65]mm · 3 of 35 slices shown]
[im 3/35  bone]
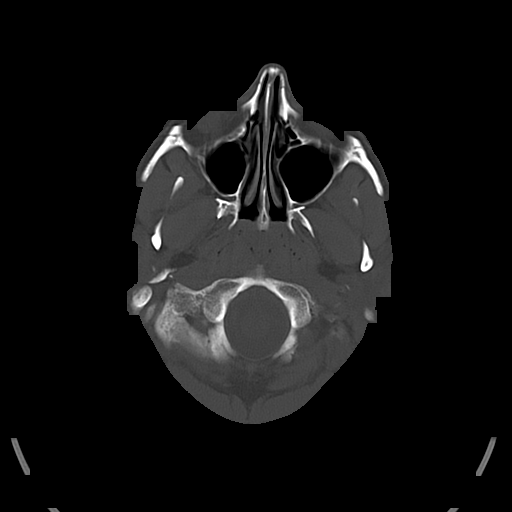
[im 8/35  bone]
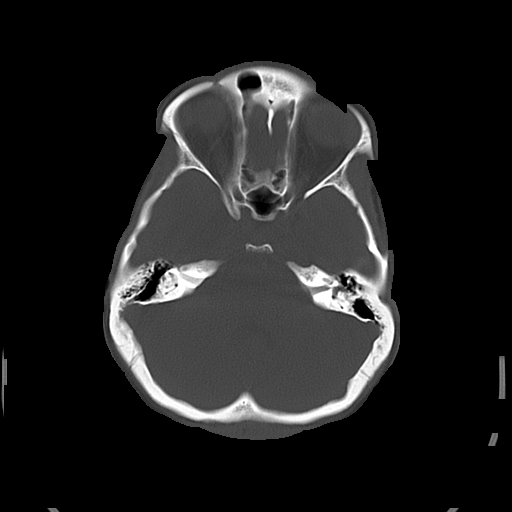
[im 13/35  bone]
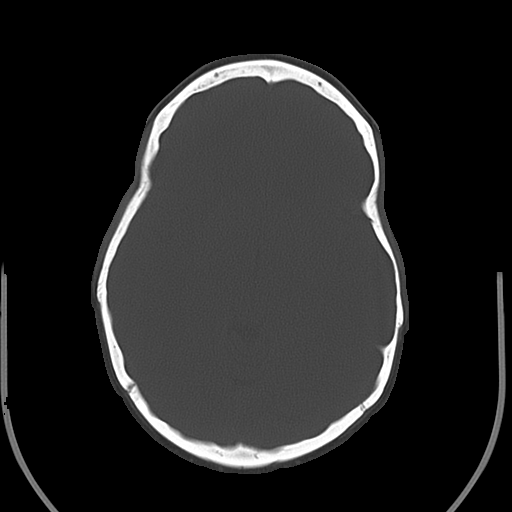

[16 of 30 positions shown; findings below may reference images not displayed]

FINDINGS: No acute intracranial hemorrhage.  No focal mass lesion.
No CT evidence of acute infarction.   No midline shift or mass
effect.  No hydrocephalus.  Basilar cisterns are patent. Paranasal
sinuses and mastoid air cells are clear.  Orbits are normal.
IMPRESSION: Normal head CT

## 2013-06-03 ENCOUNTER — Encounter (HOSPITAL_BASED_OUTPATIENT_CLINIC_OR_DEPARTMENT_OTHER): Payer: Self-pay | Admitting: Emergency Medicine

## 2013-06-03 ENCOUNTER — Emergency Department (HOSPITAL_BASED_OUTPATIENT_CLINIC_OR_DEPARTMENT_OTHER)
Admission: EM | Admit: 2013-06-03 | Discharge: 2013-06-03 | Disposition: A | Discharge status: Home / Self Care | Payer: Medicaid Other | Attending: Emergency Medicine | Admitting: Emergency Medicine

## 2013-06-03 DIAGNOSIS — J029 Acute pharyngitis, unspecified: Secondary | ICD-10-CM | POA: Insufficient documentation

## 2013-06-03 DIAGNOSIS — R110 Nausea: Secondary | ICD-10-CM | POA: Insufficient documentation

## 2013-06-03 DIAGNOSIS — Z87440 Personal history of urinary (tract) infections: Secondary | ICD-10-CM | POA: Insufficient documentation

## 2013-06-03 DIAGNOSIS — J069 Acute upper respiratory infection, unspecified: Secondary | ICD-10-CM

## 2013-06-03 DIAGNOSIS — Z8742 Personal history of other diseases of the female genital tract: Secondary | ICD-10-CM | POA: Insufficient documentation

## 2013-06-03 DIAGNOSIS — O9989 Other specified diseases and conditions complicating pregnancy, childbirth and the puerperium: Secondary | ICD-10-CM | POA: Insufficient documentation

## 2013-06-03 DIAGNOSIS — J3489 Other specified disorders of nose and nasal sinuses: Secondary | ICD-10-CM | POA: Insufficient documentation

## 2013-06-03 DIAGNOSIS — Z3490 Encounter for supervision of normal pregnancy, unspecified, unspecified trimester: Secondary | ICD-10-CM

## 2013-06-03 DIAGNOSIS — Z87891 Personal history of nicotine dependence: Secondary | ICD-10-CM | POA: Insufficient documentation

## 2013-06-03 DIAGNOSIS — H9209 Otalgia, unspecified ear: Secondary | ICD-10-CM | POA: Insufficient documentation

## 2013-06-03 DIAGNOSIS — Z8659 Personal history of other mental and behavioral disorders: Secondary | ICD-10-CM | POA: Insufficient documentation

## 2013-06-03 LAB — RAPID STREP SCREEN (MED CTR MEBANE ONLY): Streptococcus, Group A Screen (Direct): NEGATIVE

## 2013-06-03 MED ORDER — GUAIFENESIN 200 MG PO TABS: 400.0000 mg | ORAL_TABLET | ORAL | Status: DC | PRN

## 2013-06-03 NOTE — ED Notes (Signed)
Pt c/o bilateral ear pain, nasal congestion, and sore throat onset yesterday and progressively worse overnight. Reports some nausea. Denies known fever. Pt [redacted] weeks pregnant.

## 2013-06-03 NOTE — ED Provider Notes (Signed)
History    CSN: [id number] Arrival date & time 06/03/13  1111  First MD Initiated Contact with Patient 06/03/13 1206     Chief Complaint  Patient presents with  . Otalgia  . Sore Throat   (Consider location/radiation/quality/duration/timing/severity/associated sxs/prior Treatment) HPI Comments: Patient is a 27 year old female who is [redacted] weeks pregnant who presents today with a sore throat since yesterday afternoon and left ear pain since last night. The sore throat is worse with eating and swallowing. Her ear pain is sharp. She has not taken any medications and she is not sure what will be safe for the baby. She additionally reports mild nausea and congestion. She does not have a cough, abdominal pain, vomiting, fevers, chills.    The history is provided by the patient. No language interpreter was used.   Past Medical History  Diagnosis Date  . Anxiety   . Ovarian cyst   . UTI (lower urinary tract infection)    Past Surgical History  Procedure Laterality Date  . No past surgeries     No family history on file. History  Substance Use Topics  . Smoking status: Former Smoker    Quit date: 04/02/2007  . Smokeless tobacco: Not on file  . Alcohol Use: Yes     Comment: occassionally/not since pregnancy   OB History   Grav Para Term Preterm Abortions TAB SAB Ect Mult Living   2 1 1       1      Review of Systems  Constitutional: Negative for fever and chills.  HENT: Positive for ear pain, congestion, sore throat and sinus pressure. Negative for drooling.   Respiratory: Negative for cough and shortness of breath.   Gastrointestinal: Positive for nausea and abdominal distention. Negative for vomiting and abdominal pain.  All other systems reviewed and are negative.    Allergies  Review of patient's allergies indicates no known allergies.  Home Medications   Current Outpatient Rx  Name  Route  Sig  Dispense  Refill  . Prenatal Vit-Fe Fumarate-FA (PRENATAL MULTIVITAMIN)  TABS   Oral   Take 1 tablet by mouth daily at 12 noon.          BP 117/77  Pulse 103  Temp(Src) 98.3 F (36.8 C) (Oral)  Resp 16  Ht 5\' 6"  (1.676 m)  Wt 141 lb (63.957 kg)  BMI 22.77 kg/m2  SpO2 99%  LMP 01/01/2013 Physical Exam  Nursing note and vitals reviewed. Constitutional: She is oriented to person, place, and time. She appears well-developed and well-nourished. She is cooperative.  Non-toxic appearance. She does not have a sickly appearance. She does not appear ill. No distress.  HENT:  Head: Normocephalic and atraumatic. No trismus in the jaw.  Right Ear: External ear and ear canal normal. No drainage or tenderness. No mastoid tenderness. Tympanic membrane is not injected. A middle ear effusion is present.  Left Ear: External ear and ear canal normal. No drainage or tenderness. No mastoid tenderness. Tympanic membrane is not injected. A middle ear effusion is present.  Nose: No rhinorrhea. Right sinus exhibits maxillary sinus tenderness (mild). Right sinus exhibits no frontal sinus tenderness. Left sinus exhibits maxillary sinus tenderness (mild). Left sinus exhibits no frontal sinus tenderness.  Mouth/Throat: Uvula is midline, oropharynx is clear and moist and mucous membranes are normal.  Fluid behind TM bilaterally  Eyes: Conjunctivae are normal.  Neck: Trachea normal, normal range of motion and phonation normal.  No nuchal rigidity or meningeal signs  Cardiovascular:  Normal rate, regular rhythm and normal heart sounds.   Pulmonary/Chest: Effort normal and breath sounds normal. No stridor. No respiratory distress. She has no wheezes. She has no rales.  Abdominal: Soft. She exhibits distension.  Musculoskeletal: Normal range of motion.  Neurological: She is alert and oriented to person, place, and time. She has normal strength.  Skin: Skin is warm and dry. She is not diaphoretic. No erythema.  Psychiatric: She has a normal mood and affect. Her behavior is normal.     ED Course  Procedures (including critical care time) Labs Reviewed  RAPID STREP SCREEN  CULTURE, GROUP A STREP   No results found. 1. URI (upper respiratory infection)   2. Pregnancy     MDM  Patients symptoms are consistent with URI, likely viral etiology. Discussed that antibiotics are not indicated for viral infections. Pt will be discharged with symptomatic treatment.  Gave patient a list of medications safe to take in pregnancy for common problems including, cough/cold/congestion. Verbalizes understanding and is agreeable with plan. Pt is hemodynamically stable & in NAD prior to dc.   Mora Bellman, PA-C 06/03/13 1307

## 2013-06-04 NOTE — ED Provider Notes (Signed)
Medical screening examination/treatment/procedure(s) were performed by non-physician practitioner and as supervising physician I was immediately available for consultation/collaboration.   Kariem Wolfson Y. Makaylee Spielberg, MD 06/04/13 1523 

## 2013-06-06 LAB — CULTURE, GROUP A STREP

## 2013-11-13 ENCOUNTER — Encounter (HOSPITAL_BASED_OUTPATIENT_CLINIC_OR_DEPARTMENT_OTHER): Payer: Self-pay | Admitting: Emergency Medicine

## 2013-11-13 ENCOUNTER — Emergency Department (HOSPITAL_BASED_OUTPATIENT_CLINIC_OR_DEPARTMENT_OTHER)
Admission: EM | Admit: 2013-11-13 | Discharge: 2013-11-13 | Disposition: A | Discharge status: Home / Self Care | Payer: Medicaid Other | Attending: Emergency Medicine | Admitting: Emergency Medicine

## 2013-11-13 DIAGNOSIS — Z79899 Other long term (current) drug therapy: Secondary | ICD-10-CM | POA: Insufficient documentation

## 2013-11-13 DIAGNOSIS — H9209 Otalgia, unspecified ear: Secondary | ICD-10-CM | POA: Insufficient documentation

## 2013-11-13 DIAGNOSIS — Z8659 Personal history of other mental and behavioral disorders: Secondary | ICD-10-CM | POA: Insufficient documentation

## 2013-11-13 DIAGNOSIS — J020 Streptococcal pharyngitis: Secondary | ICD-10-CM | POA: Insufficient documentation

## 2013-11-13 DIAGNOSIS — Z87891 Personal history of nicotine dependence: Secondary | ICD-10-CM | POA: Insufficient documentation

## 2013-11-13 DIAGNOSIS — Z87440 Personal history of urinary (tract) infections: Secondary | ICD-10-CM | POA: Insufficient documentation

## 2013-11-13 MED ORDER — PENICILLIN G BENZATHINE 1200000 UNIT/2ML IM SUSP
1.2000 10*6.[IU] | Freq: Once | INTRAMUSCULAR | Status: AC
  Administered 2013-11-13: 1.2 10*6.[IU] via INTRAMUSCULAR
  Filled 2013-11-13: qty 2

## 2013-11-13 NOTE — ED Provider Notes (Signed)
CSN: [id number]     Arrival date & time 11/13/13  0944 History   First MD Initiated Contact with Patient 11/13/13 [phone]     Chief Complaint  Patient presents with  . Sore Throat  . Otalgia   (Consider location/radiation/quality/duration/timing/severity/associated sxs/prior Treatment) Patient is a 27 y.o. female presenting with URI.  URI Presenting symptoms: ear pain and sore throat   Presenting symptoms: no congestion, no cough and no fever   Severity:  Moderate Onset quality:  Gradual Duration:  2 days Timing:  Constant Progression:  Worsening Chronicity:  New Relieved by:  OTC medications Worsened by:  Drinking and eating Associated symptoms: swollen glands   Risk factors: sick contacts     Past Medical History  Diagnosis Date  . Anxiety   . Ovarian cyst   . UTI (lower urinary tract infection)    Past Surgical History  Procedure Laterality Date  . No past surgeries     History reviewed. No pertinent family history. History  Substance Use Topics  . Smoking status: Former Smoker    Quit date: 04/02/2007  . Smokeless tobacco: Not on file  . Alcohol Use: Yes     Comment: occassionally/not since pregnancy   OB History   Grav Para Term Preterm Abortions TAB SAB Ect Mult Living   2 1 1       1      Review of Systems  Constitutional: Negative for fever.  HENT: Positive for ear pain and sore throat. Negative for congestion.   Respiratory: Negative for cough and shortness of breath.   Cardiovascular: Negative for chest pain.  Gastrointestinal: Negative for nausea, vomiting, abdominal pain and diarrhea.  All other systems reviewed and are negative.    Allergies  Review of patient's allergies indicates no known allergies.  Home Medications   Current Outpatient Rx  Name  Route  Sig  Dispense  Refill  . guaiFENesin 200 MG tablet   Oral   Take 2 tablets (400 mg total) by mouth every 4 (four) hours as needed for congestion.   30 suppository   0   . Prenatal  Vit-Fe Fumarate-FA (PRENATAL MULTIVITAMIN) TABS   Oral   Take 1 tablet by mouth daily at 12 noon.          LMP 01/01/2013 Physical Exam  Nursing note and vitals reviewed. Constitutional: She is oriented to person, place, and time. She appears well-developed and well-nourished. No distress.  HENT:  Head: Normocephalic and atraumatic.  Right Ear: Tympanic membrane is scarred. Tympanic membrane is not bulging. No middle ear effusion.  Left Ear: Tympanic membrane is scarred. Tympanic membrane is not bulging.  No middle ear effusion.  Mouth/Throat: Oropharynx is clear and moist. No tonsillar abscesses.  Mildly swollen tonsils, moderate amt of exudate  Eyes: Conjunctivae are normal. Pupils are equal, round, and reactive to light. No scleral icterus.  Neck: Neck supple.  Cardiovascular: Normal rate, regular rhythm, normal heart sounds and intact distal pulses.   No murmur heard. Pulmonary/Chest: Effort normal and breath sounds normal. No stridor. No respiratory distress. She has no rales.  Abdominal: Soft. Bowel sounds are normal. She exhibits no distension. There is no tenderness.  Musculoskeletal: Normal range of motion.  Lymphadenopathy:       Head (right side): Submandibular adenopathy present.       Head (left side): Submandibular adenopathy present.  Neurological: She is alert and oriented to person, place, and time.  Skin: Skin is warm and dry. No rash noted.  Psychiatric: She has a normal mood and affect. Her behavior is normal.    ED Course  Procedures (including critical care time) Labs Review Labs Reviewed  RAPID STREP SCREEN - Abnormal; Notable for the following:    Streptococcus, Group A Screen (Direct) POSITIVE (*)    All other components within normal limits   Imaging Review No results found.  EKG Interpretation   None       MDM   1. Strep throat    27 yo female, two months postpartum, with sore throat and ear congestion.  Well appearing, not distressed,  nontoxic.    Strep positive.  Will treat with bicillin  Candyce Churn, MD 11/13/13 [phone]

## 2013-11-13 NOTE — ED Notes (Signed)
Pt amb to room 9 with quick steady gait in nad. Pt reports 2 days of cough, congestion and body aches.

## 2014-02-04 ENCOUNTER — Encounter (HOSPITAL_COMMUNITY): Payer: Self-pay | Admitting: *Deleted

## 2014-04-17 ENCOUNTER — Emergency Department (HOSPITAL_BASED_OUTPATIENT_CLINIC_OR_DEPARTMENT_OTHER)
Admission: EM | Admit: 2014-04-17 | Discharge: 2014-04-17 | Disposition: A | Discharge status: Home / Self Care | Payer: Medicaid Other | Attending: Emergency Medicine | Admitting: Emergency Medicine

## 2014-04-17 ENCOUNTER — Encounter (HOSPITAL_BASED_OUTPATIENT_CLINIC_OR_DEPARTMENT_OTHER): Payer: Self-pay | Admitting: Emergency Medicine

## 2014-04-17 DIAGNOSIS — E860 Dehydration: Secondary | ICD-10-CM

## 2014-04-17 DIAGNOSIS — Z8742 Personal history of other diseases of the female genital tract: Secondary | ICD-10-CM | POA: Insufficient documentation

## 2014-04-17 DIAGNOSIS — Z3202 Encounter for pregnancy test, result negative: Secondary | ICD-10-CM | POA: Insufficient documentation

## 2014-04-17 DIAGNOSIS — Z8659 Personal history of other mental and behavioral disorders: Secondary | ICD-10-CM | POA: Insufficient documentation

## 2014-04-17 DIAGNOSIS — Z87891 Personal history of nicotine dependence: Secondary | ICD-10-CM | POA: Insufficient documentation

## 2014-04-17 DIAGNOSIS — Z87440 Personal history of urinary (tract) infections: Secondary | ICD-10-CM | POA: Insufficient documentation

## 2014-04-17 DIAGNOSIS — R110 Nausea: Secondary | ICD-10-CM | POA: Insufficient documentation

## 2014-04-17 LAB — PREGNANCY, URINE: PREG TEST UR: NEGATIVE

## 2014-04-17 LAB — URINALYSIS, ROUTINE W REFLEX MICROSCOPIC
BILIRUBIN URINE: NEGATIVE
Glucose, UA: NEGATIVE mg/dL
Hgb urine dipstick: NEGATIVE
NITRITE: NEGATIVE
Protein, ur: NEGATIVE mg/dL
Specific Gravity, Urine: 1.02 (ref 1.005–1.030)
UROBILINOGEN UA: 1 mg/dL (ref 0.0–1.0)
pH: 6 (ref 5.0–8.0)

## 2014-04-17 LAB — URINE MICROSCOPIC-ADD ON

## 2014-04-17 LAB — RAPID STREP SCREEN (MED CTR MEBANE ONLY): STREPTOCOCCUS, GROUP A SCREEN (DIRECT): NEGATIVE

## 2014-04-17 MED ORDER — ONDANSETRON 4 MG PO TBDP
4.0000 mg | ORAL_TABLET | Freq: Once | ORAL | Status: AC
  Administered 2014-04-17: 4 mg via ORAL
  Filled 2014-04-17: qty 1

## 2014-04-17 MED ORDER — IBUPROFEN 800 MG PO TABS
800.0000 mg | ORAL_TABLET | Freq: Once | ORAL | Status: AC
  Administered 2014-04-17: 800 mg via ORAL
  Filled 2014-04-17: qty 1

## 2014-04-17 MED ORDER — SODIUM CHLORIDE 0.9 % IV SOLN
Freq: Once | INTRAVENOUS | Status: AC
  Administered 2014-04-17: 14:00:00 via INTRAVENOUS

## 2014-04-17 NOTE — ED Notes (Signed)
Nausea and generalized body aches since yesterday.

## 2014-04-17 NOTE — Discharge Instructions (Signed)
Dehydration, Adult Dehydration is when you lose more fluids from the body than you take in. Vital organs like the kidneys, brain, and heart cannot function without a proper amount of fluids and salt. Any loss of fluids from the body can cause dehydration.  CAUSES   Vomiting.  Diarrhea.  Excessive sweating.  Excessive urine output.  Fever. SYMPTOMS  Mild dehydration  Thirst.  Dry lips.  Slightly dry mouth. Moderate dehydration  Very dry mouth.  Sunken eyes.  Skin does not bounce back quickly when lightly pinched and released.  Dark urine and decreased urine production.  Decreased tear production.  Headache. Severe dehydration  Very dry mouth.  Extreme thirst.  Rapid, weak pulse (more than 100 beats per minute at rest).  Cold hands and feet.  Not able to sweat in spite of heat and temperature.  Rapid breathing.  Blue lips.  Confusion and lethargy.  Difficulty being awakened.  Minimal urine production.  No tears. DIAGNOSIS  Your caregiver will diagnose dehydration based on your symptoms and your exam. Blood and urine tests will help confirm the diagnosis. The diagnostic evaluation should also identify the cause of dehydration. TREATMENT  Treatment of mild or moderate dehydration can often be done at home by increasing the amount of fluids that you drink. It is best to drink small amounts of fluid more often. Drinking too much at one time can make vomiting worse. Refer to the home care instructions below. Severe dehydration needs to be treated at the hospital where you will probably be given intravenous (IV) fluids that contain water and electrolytes. HOME CARE INSTRUCTIONS   Ask your caregiver about specific rehydration instructions.  Drink enough fluids to keep your urine clear or pale yellow.  Drink small amounts frequently if you have nausea and vomiting.  Eat as you normally do.  Avoid:  Foods or drinks high in sugar.  Carbonated  drinks.  Juice.  Extremely hot or cold fluids.  Drinks with caffeine.  Fatty, greasy foods.  Alcohol.  Tobacco.  Overeating.  Gelatin desserts.  Wash your hands well to avoid spreading bacteria and viruses.  Only take over-the-counter or prescription medicines for pain, discomfort, or fever as directed by your caregiver.  Ask your caregiver if you should continue all prescribed and over-the-counter medicines.  Keep all follow-up appointments with your caregiver. SEEK MEDICAL CARE IF:  You have abdominal pain and it increases or stays in one area (localizes).  You have a rash, stiff neck, or severe headache.  You are irritable, sleepy, or difficult to awaken.  You are weak, dizzy, or extremely thirsty. SEEK IMMEDIATE MEDICAL CARE IF:   You are unable to keep fluids down or you get worse despite treatment.  You have frequent episodes of vomiting or diarrhea.  You have blood or green matter (bile) in your vomit.  You have blood in your stool or your stool looks black and tarry.  You have not urinated in 6 to 8 hours, or you have only urinated a small amount of very dark urine.  You have a fever.  You faint. MAKE SURE YOU:   Understand these instructions.  Will watch your condition.  Will get help right away if you are not doing well or get worse. Document Released: 11/09/2005 Document Revised: 02/01/2012 Document Reviewed: 06/29/2011 Exit[employer] Patient Information 2014 [employer].  

## 2014-04-17 NOTE — ED Provider Notes (Signed)
Medical screening examination/treatment/procedure(s) were performed by non-physician practitioner and as supervising physician I was immediately available for consultation/collaboration.   EKG Interpretation None       Cederic Mozley, MD 04/17/14 1716 

## 2014-04-17 NOTE — ED Provider Notes (Signed)
CSN: [id number]     Arrival date & time 04/17/14  1041 History   First MD Initiated Contact with Patient 04/17/14 1157     Chief Complaint  Patient presents with  . Generalized Body Aches     (Consider location/radiation/quality/duration/timing/severity/associated sxs/prior Treatment) Patient is a 28 y.o. female presenting with weakness. The history is provided by the patient. No language interpreter was used.  Weakness This is a new problem. The current episode started today. The problem occurs constantly. The problem has been gradually worsening. Associated symptoms include weakness. Nothing aggravates the symptoms. She has tried nothing for the symptoms. The treatment provided no relief.  Pt reports she feels nauseated.  Pt complains of feeling achy all over  Past Medical History  Diagnosis Date  . Anxiety   . Ovarian cyst   . UTI (lower urinary tract infection)    Past Surgical History  Procedure Laterality Date  . No past surgeries     No family history on file. History  Substance Use Topics  . Smoking status: Former Smoker    Quit date: 04/02/2007  . Smokeless tobacco: Not on file  . Alcohol Use: Yes     Comment: occassionally   OB History   Grav Para Term Preterm Abortions TAB SAB Ect Mult Living   2 1 1       1      Review of Systems  Neurological: Positive for weakness.  All other systems reviewed and are negative.     Allergies  Review of patient's allergies indicates no known allergies.  Home Medications   Prior to Admission medications   Medication Sig Start Date End Date Taking? Authorizing Provider  guaiFENesin 200 MG tablet Take 2 tablets (400 mg total) by mouth every 4 (four) hours as needed for congestion. 06/03/13   Mora Bellman, PA-C  Prenatal Vit-Fe Fumarate-FA (PRENATAL MULTIVITAMIN) TABS Take 1 tablet by mouth daily at 12 noon.    Historical Provider, MD   BP 104/68  Pulse 114  Temp(Src) 100.3 F (37.9 C) (Oral)  Resp 16  Ht 5\' 6"   (1.676 m)  Wt 128 lb (58.06 kg)  BMI 20.67 kg/m2  SpO2 97%  Breastfeeding? No Physical Exam  Nursing note and vitals reviewed. Constitutional: She is oriented to person, place, and time. She appears well-developed and well-nourished.  HENT:  Head: Normocephalic.  Eyes: EOM are normal.  Neck: Normal range of motion.  Cardiovascular: Normal rate and normal heart sounds.   Pulmonary/Chest: Effort normal.  Abdominal: Soft. Bowel sounds are normal.  Musculoskeletal: Normal range of motion.  Neurological: She is alert and oriented to person, place, and time.  Skin: Skin is warm.  Psychiatric: She has a normal mood and affect.    ED Course  Procedures (including critical care time) Labs Review Labs Reviewed  URINALYSIS, ROUTINE W REFLEX MICROSCOPIC - Abnormal; Notable for the following:    Ketones, ur >80 (*)    Leukocytes, UA TRACE (*)    All other components within normal limits  URINE MICROSCOPIC-ADD ON - Abnormal; Notable for the following:    Squamous Epithelial / LPF FEW (*)    All other components within normal limits  PREGNANCY, URINE    Imaging Review No results found.   EKG Interpretation None      MDM   Final diagnoses:  Dehydration    Pt advised to increase oral fluid     Elson Areas, PA-C 04/17/14 1631

## 2014-04-17 NOTE — ED Notes (Signed)
Patient states she is unable to void at this time.  

## 2014-04-19 LAB — CULTURE, GROUP A STREP

## 2014-04-20 NOTE — Progress Notes (Signed)
ED Antimicrobial Stewardship Positive Culture Follow Up   Kelsey Day is an 28 y.o. female who presented to [facility] on 04/17/2014 with a chief complaint of  Chief Complaint  Patient presents with  . Generalized Body Aches    Recent Results (from the past 720 hour(s))  RAPID STREP SCREEN     Status: None   Collection Time    04/17/14 12:50 PM      Result Value Ref Range Status   Streptococcus, Group A Screen (Direct) NEGATIVE  NEGATIVE Final   Comment: (NOTE)     A Rapid Antigen test may result negative if the antigen level in the     sample is below the detection level of this test. The FDA has not     cleared this test as a stand-alone test therefore the rapid antigen     negative result has reflexed to a Group A Strep culture.  CULTURE, GROUP A STREP     Status: None   Collection Time    04/17/14 12:50 PM      Result Value Ref Range Status   Specimen Description THROAT   Final   Special Requests NONE   Final   Culture     Final   Value: RARE GROUP A STREP (S.PYOGENES) ISOLATED     Performed at [facility]   Report Status 04/19/2014 FINAL   Final   [x]  Patient discharged originally without antimicrobial agent and treatment is now indicated  New antibiotic prescription: Amoxicillin 500 mg PO BID for 10 days  ED Provider: Junius Finner, PA-C   Anabel Bene 04/20/2014, 10:31 AM Infectious Diseases Pharmacist Phone# [phone]

## 2014-04-21 ENCOUNTER — Telehealth (HOSPITAL_BASED_OUTPATIENT_CLINIC_OR_DEPARTMENT_OTHER): Payer: Self-pay | Admitting: Emergency Medicine

## 2014-04-28 NOTE — Telephone Encounter (Signed)
Unable to contact patient via phone. Sent letter. °

## 2014-06-19 ENCOUNTER — Telehealth (HOSPITAL_BASED_OUTPATIENT_CLINIC_OR_DEPARTMENT_OTHER): Payer: Self-pay | Admitting: Emergency Medicine

## 2014-06-19 NOTE — Telephone Encounter (Signed)
No response to letter sent after 30 days. Chart sent to Medical Records. °

## 2014-09-24 ENCOUNTER — Encounter (HOSPITAL_BASED_OUTPATIENT_CLINIC_OR_DEPARTMENT_OTHER): Payer: Self-pay | Admitting: Emergency Medicine

## 2014-12-03 ENCOUNTER — Emergency Department (HOSPITAL_COMMUNITY)
Admission: EM | Admit: 2014-12-03 | Discharge: 2014-12-03 | Disposition: A | Discharge status: Home / Self Care | Payer: Medicaid Other | Attending: Emergency Medicine | Admitting: Emergency Medicine

## 2014-12-03 ENCOUNTER — Encounter (HOSPITAL_COMMUNITY): Payer: Self-pay | Admitting: Emergency Medicine

## 2014-12-03 DIAGNOSIS — Z8742 Personal history of other diseases of the female genital tract: Secondary | ICD-10-CM | POA: Insufficient documentation

## 2014-12-03 DIAGNOSIS — Y9389 Activity, other specified: Secondary | ICD-10-CM | POA: Insufficient documentation

## 2014-12-03 DIAGNOSIS — Y998 Other external cause status: Secondary | ICD-10-CM | POA: Insufficient documentation

## 2014-12-03 DIAGNOSIS — Y9289 Other specified places as the place of occurrence of the external cause: Secondary | ICD-10-CM | POA: Diagnosis not present

## 2014-12-03 DIAGNOSIS — Z87891 Personal history of nicotine dependence: Secondary | ICD-10-CM | POA: Insufficient documentation

## 2014-12-03 DIAGNOSIS — S199XXA Unspecified injury of neck, initial encounter: Secondary | ICD-10-CM | POA: Insufficient documentation

## 2014-12-03 DIAGNOSIS — Z79899 Other long term (current) drug therapy: Secondary | ICD-10-CM | POA: Diagnosis not present

## 2014-12-03 DIAGNOSIS — Z8659 Personal history of other mental and behavioral disorders: Secondary | ICD-10-CM | POA: Insufficient documentation

## 2014-12-03 DIAGNOSIS — M542 Cervicalgia: Secondary | ICD-10-CM

## 2014-12-03 DIAGNOSIS — X58XXXA Exposure to other specified factors, initial encounter: Secondary | ICD-10-CM | POA: Diagnosis not present

## 2014-12-03 DIAGNOSIS — Z87440 Personal history of urinary (tract) infections: Secondary | ICD-10-CM | POA: Insufficient documentation

## 2014-12-03 MED ORDER — IBUPROFEN 800 MG PO TABS
800.0000 mg | ORAL_TABLET | Freq: Once | ORAL | Status: AC
  Administered 2014-12-03: 800 mg via ORAL
  Filled 2014-12-03: qty 1

## 2014-12-03 MED ORDER — CYCLOBENZAPRINE HCL 10 MG PO TABS: 10.0000 mg | ORAL_TABLET | Freq: Three times a day (TID) | ORAL | Status: DC | PRN

## 2014-12-03 MED ORDER — OXYCODONE HCL 5 MG PO TABS: 5.0000 mg | ORAL_TABLET | Freq: Four times a day (QID) | ORAL | Status: DC | PRN

## 2014-12-03 MED ORDER — CYCLOBENZAPRINE HCL 10 MG PO TABS
5.0000 mg | ORAL_TABLET | Freq: Once | ORAL | Status: AC
  Administered 2014-12-03: 5 mg via ORAL
  Filled 2014-12-03: qty 1

## 2014-12-03 MED ORDER — OXYCODONE HCL 5 MG PO TABS
5.0000 mg | ORAL_TABLET | ORAL | Status: DC
  Filled 2014-12-03: qty 1

## 2014-12-03 NOTE — ED Provider Notes (Signed)
CSN: [id number]     Arrival date & time 12/03/14  [id number] History   First MD Initiated Contact with Patient 12/03/14 0825     Chief Complaint  Patient presents with  . Neck Pain     (Consider location/radiation/quality/duration/timing/severity/associated sxs/prior Treatment) Patient is a 29 y.o. female presenting with neck pain. The history is provided by the patient.  Neck Pain Pain location:  L side Quality:  Aching Pain radiates to:  Does not radiate Pain severity:  Moderate Pain is:  Same all the time Onset quality:  Sudden Duration:  2 days Timing:  Constant Progression:  Unchanged Chronicity:  New Context comment:  While turning her head in bed. Relieved by:  Nothing Worsened by:  Position (turning neck) Ineffective treatments:  NSAIDs, ice and heat Associated symptoms: no chest pain, no fever and no headaches     Past Medical History  Diagnosis Date  . Anxiety   . Ovarian cyst   . UTI (lower urinary tract infection)    Past Surgical History  Procedure Laterality Date  . No past surgeries     History reviewed. No pertinent family history. History  Substance Use Topics  . Smoking status: Former Smoker    Quit date: 04/02/2007  . Smokeless tobacco: Not on file  . Alcohol Use: Yes     Comment: occassionally   OB History    Gravida Para Term Preterm AB TAB SAB Ectopic Multiple Living   2 1 1       1      Review of Systems  Constitutional: Negative for fever and fatigue.  HENT: Negative for congestion and drooling.        Neck pain  Eyes: Negative for pain.  Respiratory: Negative for cough and shortness of breath.   Cardiovascular: Negative for chest pain.  Gastrointestinal: Negative for nausea, vomiting, abdominal pain and diarrhea.  Genitourinary: Negative for dysuria and hematuria.  Musculoskeletal: Negative for back pain, gait problem and neck pain.  Skin: Negative for color change.  Neurological: Negative for dizziness and headaches.  Hematological:  Negative for adenopathy.  Psychiatric/Behavioral: Negative for behavioral problems.  All other systems reviewed and are negative.     Allergies  Review of patient's allergies indicates no known allergies.  Home Medications   Prior to Admission medications   Medication Sig Start Date End Date Taking? Authorizing Provider  guaiFENesin 200 MG tablet Take 2 tablets (400 mg total) by mouth every 4 (four) hours as needed for congestion. 06/03/13   Mora BellmanHannah S Merrell, PA-C  Prenatal Vit-Fe Fumarate-FA (PRENATAL MULTIVITAMIN) TABS Take 1 tablet by mouth daily at 12 noon.    Historical Provider, MD   BP 131/100 mmHg  Pulse 115  Resp 18  SpO2 100% Physical Exam  Constitutional: She is oriented to person, place, and time. She appears well-developed and well-nourished.  HENT:  Head: Normocephalic and atraumatic.  Mouth/Throat: Oropharynx is clear and moist. No oropharyngeal exudate.  Normal-appearing tympanic membranes bilaterally.  Eyes: Conjunctivae and EOM are normal. Pupils are equal, round, and reactive to light.  Neck: Neck supple. No tracheal deviation present. No thyromegaly present.  Significantly limited range of motion to the right. Moderately limited range of motion of the neck to the left.  Tenderness to palpation of the left mid to upper paracervical spinal area.  No focal vertebral tenderness noted.  Cardiovascular: Normal rate, regular rhythm, normal heart sounds and intact distal pulses.  Exam reveals no gallop and no friction rub.   No murmur  heard. Pulmonary/Chest: Effort normal and breath sounds normal. No stridor. No respiratory distress. She has no wheezes.  Abdominal: Soft. Bowel sounds are normal. There is no tenderness. There is no rebound and no guarding.  Musculoskeletal: Normal range of motion. She exhibits no edema or tenderness.  Normal strength and sensation in the upper and lower extremities.  Lymphadenopathy:    She has no cervical adenopathy.   Neurological: She is alert and oriented to person, place, and time.  Skin: Skin is warm and dry.  Psychiatric: She has a normal mood and affect. Her behavior is normal.  Nursing note and vitals reviewed.   ED Course  Procedures (including critical care time) Labs Review Labs Reviewed - No data to display  Imaging Review No results found.   EKG Interpretation None      MDM   Final diagnoses:  Neck pain    8:37 AM 29 y.o. female who presents with left paraspinal cervical neck pain which began 2 days ago. She states it began abruptly when she rolled over in bed Saturday morning. She states that she turned her head and felt a pop. She has had constant ongoing neck pain since that time. She denies any fevers or other injuries. She denies any headache. Afebrile and mildly tachycardic here. She has a good story for a musculoskeletal injury. Will get pain control. LMP 1 week ago.   10:15 AM: Likely msk in nature. Pt w/ better pain control here after meds.  I have discussed the diagnosis/risks/treatment options with the patient and believe the pt to be eligible for discharge home to follow-up with her pcp as needed. We also discussed returning to the ED immediately if new or worsening sx occur. We discussed the sx which are most concerning (e.g., weakness, numbness, fever, worsening pain) that necessitate immediate return. Medications administered to the patient during their visit and any new prescriptions provided to the patient are listed below.  Medications given during this visit Medications  oxyCODONE (Oxy IR/ROXICODONE) immediate release tablet 5 mg (0 mg Oral Hold 12/03/14 0847)  ibuprofen (ADVIL,MOTRIN) tablet 800 mg (800 mg Oral Given 12/03/14 0844)  cyclobenzaprine (FLEXERIL) tablet 5 mg (5 mg Oral Given 12/03/14 0844)    New Prescriptions   CYCLOBENZAPRINE (FLEXERIL) 10 MG TABLET    Take 1 tablet (10 mg total) by mouth 3 (three) times daily as needed for muscle spasms.    OXYCODONE (OXY IR/ROXICODONE) 5 MG IMMEDIATE RELEASE TABLET    Take 1 tablet (5 mg total) by mouth every 6 (six) hours as needed for severe pain or breakthrough pain.     Purvis Sheffield, MD 12/03/14 1016

## 2014-12-03 NOTE — Discharge Instructions (Signed)
Cervical Sprain A cervical sprain is when the tissues (ligaments) that hold the neck bones in place stretch or tear. HOME CARE   Put ice on the injured area.  Put ice in a plastic bag.  Place a towel between your skin and the bag.  Leave the ice on for 15-20 minutes, 3-4 times a day.  You may have been given a collar to wear. This collar keeps your neck from moving while you heal.  Do not take the collar off unless told by your doctor.  If you have long hair, keep it outside of the collar.  Ask your doctor before changing the position of your collar. You may need to change its position over time to make it more comfortable.  If you are allowed to take off the collar for cleaning or bathing, follow your doctor's instructions on how to do it safely.  Keep your collar clean by wiping it with mild soap and water. Dry it completely. If the collar has removable pads, remove them every 1-2 days to hand wash them with soap and water. Allow them to air dry. They should be dry before you wear them in the collar.  Do not drive while wearing the collar.  Only take medicine as told by your doctor.  Keep all doctor visits as told.  Keep all physical therapy visits as told.  Adjust your work station so that you have good posture while you work.  Avoid positions and activities that make your problems worse.  Warm up and stretch before being active. GET HELP IF:  Your pain is not controlled with medicine.  You cannot take less pain medicine over time as planned.  Your activity level does not improve as expected. GET HELP RIGHT AWAY IF:   You are bleeding.  Your stomach is upset.  You have an allergic reaction to your medicine.  You develop new problems that you cannot explain.  You lose feeling (become numb) or you cannot move any part of your body (paralysis).  You have tingling or weakness in any part of your body.  Your symptoms get worse. Symptoms include:  Pain,  soreness, stiffness, puffiness (swelling), or a burning feeling in your neck.  Pain when your neck is touched.  Shoulder or upper back pain.  Limited ability to move your neck.  Headache.  Dizziness.  Your hands or arms feel week, lose feeling, or tingle.  Muscle spasms.  Difficulty swallowing or chewing. MAKE SURE YOU:   Understand these instructions.  Will watch your condition.  Will get help right away if you are not doing well or get worse. Document Released: 04/27/2008 Document Revised: 07/12/2013 Document Reviewed: 05/17/2013 [facility] Patient Information 2015 [employer] [place]. This information is not intended to replace advice given to you by your health care provider. Make sure you discuss any questions you have with your health care provider.  Take 600 mg ibuprofen every 6 hours. You can use the oxycodone for breakthrough pain.

## 2014-12-03 NOTE — Progress Notes (Signed)
pcp is [facility] [address] [place]  [phone]

## 2014-12-03 NOTE — ED Notes (Signed)
Patient was educated not to drive, operate heavy machinery, or drink alcohol while taking narcotic medication.  

## 2014-12-03 NOTE — ED Notes (Signed)
Pt c/o neck pain, onset Saturday after she tossed in bed and felt a "pop" in her neck. Pt states she called an ambulance on Saturday, paramedics evaluated her and told her her spine was not broken, pt has been unable to find pain relief. Pt is tearful, HR is elevated. MD at bedside.

## 2014-12-03 NOTE — ED Notes (Signed)
Patient was educated not to drive, operate heavy machinery, breastfeed or drink alcohol while taking narcotic medication.

## 2016-10-07 ENCOUNTER — Encounter (HOSPITAL_BASED_OUTPATIENT_CLINIC_OR_DEPARTMENT_OTHER): Payer: Self-pay

## 2016-10-07 ENCOUNTER — Emergency Department (HOSPITAL_BASED_OUTPATIENT_CLINIC_OR_DEPARTMENT_OTHER)
Admission: EM | Admit: 2016-10-07 | Discharge: 2016-10-07 | Disposition: A | Discharge status: Home / Self Care | Payer: Medicaid Other | Attending: Physician Assistant | Admitting: Physician Assistant

## 2016-10-07 DIAGNOSIS — F17200 Nicotine dependence, unspecified, uncomplicated: Secondary | ICD-10-CM | POA: Insufficient documentation

## 2016-10-07 DIAGNOSIS — H9202 Otalgia, left ear: Secondary | ICD-10-CM | POA: Diagnosis present

## 2016-10-07 DIAGNOSIS — H6692 Otitis media, unspecified, left ear: Secondary | ICD-10-CM | POA: Diagnosis not present

## 2016-10-07 MED ORDER — AMOXICILLIN 500 MG PO CAPS: 500.0000 mg | ORAL_CAPSULE | Freq: Three times a day (TID) | ORAL | 0 refills | Status: DC

## 2016-10-07 NOTE — Discharge Instructions (Signed)
Take the prescribed medication as directed. °Follow-up with your primary care doctor. °Return to the ED for new or worsening symptoms. °

## 2016-10-07 NOTE — ED Notes (Signed)
Pt verbalized understanding of discharge instructions and denies any further questions at this time.   

## 2016-10-07 NOTE — ED Notes (Signed)
ED Provider at bedside. 

## 2016-10-07 NOTE — ED Provider Notes (Signed)
[facility]   CSN: [id number] Arrival date & time: 10/07/16  1717   By signing my name below, I, Kelsey Day, attest that this documentation has been prepared under the direction and in the presence of Kelsey SitesLisa Janaysia Mcleroy, PA-C. Electronically Signed: Teofilo PodMatthew P. Day, ED Scribe. 10/07/2016. 6:14 PM.   History   Chief Complaint Chief Complaint  Patient presents with  . Otalgia     The history is provided by the patient. No language interpreter was used.   HPI Comments:  Kelsey Day is a 30 y.o. female with PMHx of multiple ear infections who presents to the Emergency Department complaining of constant left ear pain that began yesterday. Pt states that her pain feels worse than previous ear infections. Pt complains of associated nausea and mild sore throat. Pt rates her pain at 10/10. Pt states that she has been given ear drops for previous infections. No alleviating factors noted. Pt denies fever, chills, dizziness.  Past Medical History:  Diagnosis Date  . Anxiety   . Ovarian cyst   . UTI (lower urinary tract infection)     There are no active problems to display for this patient.   Past Surgical History:  Procedure Laterality Date  . NO PAST SURGERIES      OB History    Gravida Para Term Preterm AB Living   2 1 1     1    SAB TAB Ectopic Multiple Live Births                   Home Medications    Prior to Admission medications   Not on File    Family History No family history on file.  Social History Social History  Substance Use Topics  . Smoking status: Current Every Day Smoker  . Smokeless tobacco: Never Used  . Alcohol use Yes     Comment: occassionally     Allergies   Patient has no known allergies.   Review of Systems Review of Systems  HENT: Positive for ear pain and sore throat.   Gastrointestinal: Positive for nausea.  Neurological: Negative for dizziness.  All other systems reviewed and are  negative.    Physical Exam Updated Vital Signs BP 115/92 (BP Location: Left Arm)   Pulse 72   Temp 97.7 F (36.5 C) (Oral)   Resp 18   Ht 5\' 6"  (1.676 m)   Wt 132 lb (59.9 kg)   SpO2 99%   BMI 21.31 kg/m   Physical Exam  Constitutional: She is oriented to person, place, and time. She appears well-developed and well-nourished.  HENT:  Head: Normocephalic and atraumatic.  Right Ear: Tympanic membrane normal.  Left Ear: Tympanic membrane is erythematous.  Mouth/Throat: Oropharynx is clear and moist.  Left EAC and TM erythematous, no signs of rupture, no mastoid tenderness Right ear normal  Eyes: Conjunctivae and EOM are normal. Pupils are equal, round, and reactive to light.  Neck: Normal range of motion.  Cardiovascular: Normal rate, regular rhythm and normal heart sounds.   Pulmonary/Chest: Effort normal and breath sounds normal.  Abdominal: Soft. Bowel sounds are normal.  Musculoskeletal: Normal range of motion.  Neurological: She is alert and oriented to person, place, and time.  Skin: Skin is warm and dry.  Psychiatric: She has a normal mood and affect.  Nursing note and vitals reviewed.    ED Treatments / Results  DIAGNOSTIC STUDIES:  Oxygen Saturation is 99% on RA, normal by my  interpretation.    COORDINATION OF CARE:  6:14 PM Discussed treatment plan with pt at bedside and pt agreed to plan.   Labs (all labs ordered are listed, but only abnormal results are displayed) Labs Reviewed - No data to display  EKG  EKG Interpretation None       Radiology No results found.  Procedures Procedures (including critical care time)  Medications Ordered in ED Medications - No data to display   Initial Impression / Assessment and Plan / ED Course  I have reviewed the triage vital signs and the nursing notes.  Pertinent labs & imaging results that were available during my care of the patient were reviewed by me and considered in my medical decision making  (see chart for details).  Clinical Course    [age] year old female here with left ear pain. Physical exam findings concerning for otitis media. Will start on amoxicillin.  FU with PCP.  Discussed plan with patient, she acknowledged understanding and agreed with plan of care.  Return precautions given for new or worsening symptoms.  Final Clinical Impressions(s) / ED Diagnoses   Final diagnoses:  Acute infection of left ear    New Prescriptions Discharge Medication List as of 10/07/2016  6:25 PM    START taking these medications   Details  amoxicillin (AMOXIL) 500 MG capsule Take 1 capsule (500 mg total) by mouth 3 (three) times daily., Starting Wed 10/07/2016, Print       I personally performed the services described in this documentation, which was scribed in my presence. The recorded information has been reviewed and is accurate.    Garlon HatchetLisa M Jeremey Bascom, PA-C 10/07/16 1858    Courteney Randall AnLyn Mackuen, MD 10/09/16 1356

## 2016-10-07 NOTE — ED Triage Notes (Signed)
Left earache x 2 days-NAD-steady gait

## 2016-12-13 ENCOUNTER — Emergency Department (HOSPITAL_BASED_OUTPATIENT_CLINIC_OR_DEPARTMENT_OTHER)
Admission: EM | Admit: 2016-12-13 | Discharge: 2016-12-13 | Disposition: A | Discharge status: Home / Self Care | Payer: Medicaid Other | Attending: Emergency Medicine | Admitting: Emergency Medicine

## 2016-12-13 ENCOUNTER — Emergency Department (HOSPITAL_BASED_OUTPATIENT_CLINIC_OR_DEPARTMENT_OTHER): Payer: Medicaid Other

## 2016-12-13 ENCOUNTER — Encounter (HOSPITAL_BASED_OUTPATIENT_CLINIC_OR_DEPARTMENT_OTHER): Payer: Self-pay | Admitting: *Deleted

## 2016-12-13 DIAGNOSIS — X58XXXA Exposure to other specified factors, initial encounter: Secondary | ICD-10-CM | POA: Diagnosis not present

## 2016-12-13 DIAGNOSIS — Y929 Unspecified place or not applicable: Secondary | ICD-10-CM | POA: Insufficient documentation

## 2016-12-13 DIAGNOSIS — S6991XA Unspecified injury of right wrist, hand and finger(s), initial encounter: Secondary | ICD-10-CM

## 2016-12-13 DIAGNOSIS — F17200 Nicotine dependence, unspecified, uncomplicated: Secondary | ICD-10-CM | POA: Diagnosis not present

## 2016-12-13 DIAGNOSIS — Y939 Activity, unspecified: Secondary | ICD-10-CM | POA: Diagnosis not present

## 2016-12-13 DIAGNOSIS — Y999 Unspecified external cause status: Secondary | ICD-10-CM | POA: Diagnosis not present

## 2016-12-13 NOTE — ED Triage Notes (Signed)
Pt c/o right thumb injury x 3 days ago

## 2016-12-13 NOTE — Discharge Instructions (Signed)
Your x-rays today are normal. Continue taking Tylenol for pain. You may also take 800 mg of ibuprofen. Ice or thumb as well. Follow-up with your primary care physician as needed. Return to the emergency department for any new or concerning symptoms.

## 2016-12-13 NOTE — ED Provider Notes (Signed)
[facility]   CSN: [id number] Arrival date & time: 12/13/16  1139     History   Chief Complaint Chief Complaint  Patient presents with  . Finger Injury    HPI Adan B Malachi BondsBurkart is a 31 y.o. female.  HPI Patient is a right-hand-dominant 31 year old female who presents with 3 days of sudden onset, constant, mild right thumb pain. She notes it began after she went sledding. She denies any known injury or trauma. She has been taking Tylenol with some relief. She denies numbness, weakness, swelling, wound. Past Medical History:  Diagnosis Date  . Anxiety   . Ovarian cyst   . UTI (lower urinary tract infection)     There are no active problems to display for this patient.   Past Surgical History:  Procedure Laterality Date  . NO PAST SURGERIES      OB History    Gravida Para Term Preterm AB Living   2 1 1     1    SAB TAB Ectopic Multiple Live Births                   Home Medications    Prior to Admission medications   Not on File    Family History History reviewed. No pertinent family history.  Social History Social History  Substance Use Topics  . Smoking status: Current Every Day Smoker    Packs/day: 0.50  . Smokeless tobacco: Never Used  . Alcohol use Yes     Comment: occassionally     Allergies   Patient has no known allergies.   Review of Systems Review of Systems All other systems negative unless otherwise stated in HPI   Physical Exam Updated Vital Signs BP 114/82   Pulse 86   Temp 98 F (36.7 C) (Oral)   Resp 16   Ht 5\' 6"  (1.676 m)   Wt 61.2 kg   LMP 12/10/2016   SpO2 100%   BMI 21.79 kg/m   Physical Exam  Constitutional: She is oriented to person, place, and time. She appears well-developed and well-nourished.  HENT:  Head: Normocephalic and atraumatic.  Right Ear: External ear normal.  Left Ear: External ear normal.  Eyes: Conjunctivae are normal. No scleral icterus.  Neck: No tracheal  deviation present.  Cardiovascular:  First capillary refill. 2+ radial pulses bilaterally.  Pulmonary/Chest: Effort normal. No respiratory distress.  Abdominal: She exhibits no distension.  Musculoskeletal: Normal range of motion. She exhibits tenderness.  Normal range of motion of right thumb. Minimally tender at the dorsal MCP. No swelling, warmth, erythema, or bruising. Skin intact.  Neurological: She is alert and oriented to person, place, and time.  5 out of 5 strength of right thumb and right hand. Sensation intact to light touch.  Skin: Skin is warm and dry.  Psychiatric: She has a normal mood and affect. Her behavior is normal.     ED Treatments / Results  Labs (all labs ordered are listed, but only abnormal results are displayed) Labs Reviewed - No data to display  EKG  EKG Interpretation None       Radiology Dg Finger Thumb Right  Result Date: 12/13/2016 CLINICAL DATA:  Pain after trauma EXAM: RIGHT THUMB 2+V COMPARISON:  None. FINDINGS: There is no evidence of fracture or dislocation. There is no evidence of arthropathy or other focal bone abnormality. Soft tissues are unremarkable IMPRESSION: Negative. Electronically Signed   By: Gerome Samavid  Williams III M.D   On: 12/13/2016  12:59    Procedures Procedures (including critical care time)  Medications Ordered in ED Medications - No data to display   Initial Impression / Assessment and Plan / ED Course  I have reviewed the triage vital signs and the nursing notes.  Pertinent labs & imaging results that were available during my care of the patient were reviewed by me and considered in my medical decision making (see chart for details).     Patient X-Ray negative for obvious fracture or dislocation.  Pt advised to follow up with PCP. Conservative therapy recommended and discussed. Patient will be discharged home & is agreeable with above plan. Returns precautions discussed. Pt appears safe for discharge.   Final  Clinical Impressions(s) / ED Diagnoses   Final diagnoses:  Injury of right thumb, initial encounter    New Prescriptions New Prescriptions   No medications on file     Cheri Fowler, Cordelia Poche 12/13/16 1319    Tilden Fossa, MD 12/14/16 1220

## 2018-01-06 IMAGING — DX DG FINGER THUMB 2+V*R*
3 series · 3 of 3 positions shown · non-contrast
Comparison: None.

CLINICAL DATA: Pain after trauma

EXAM:
RIGHT THUMB 2+V

[finger ap]
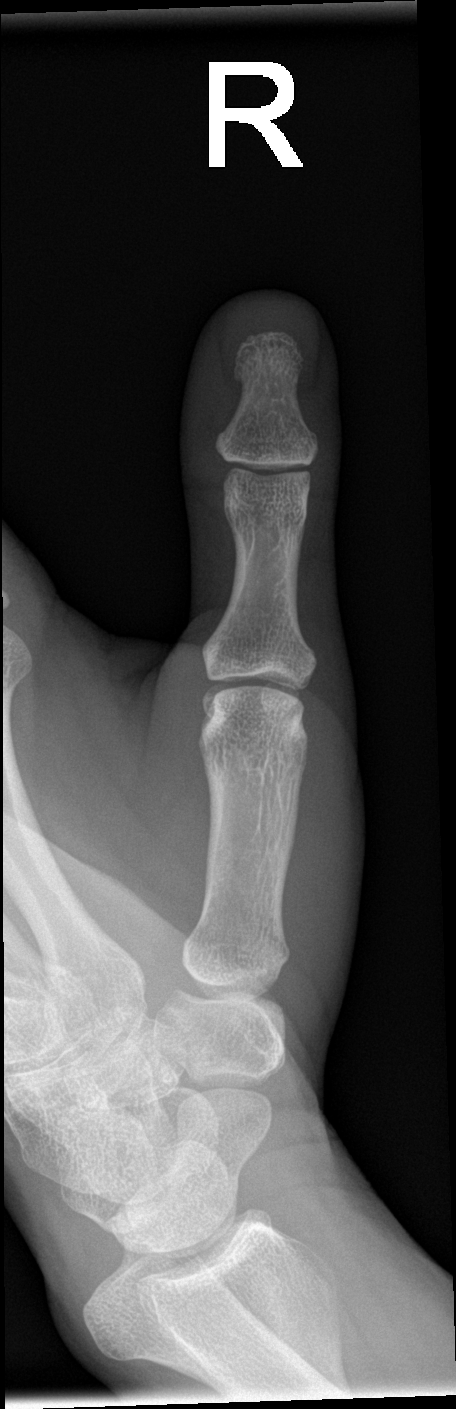

[finger obl]
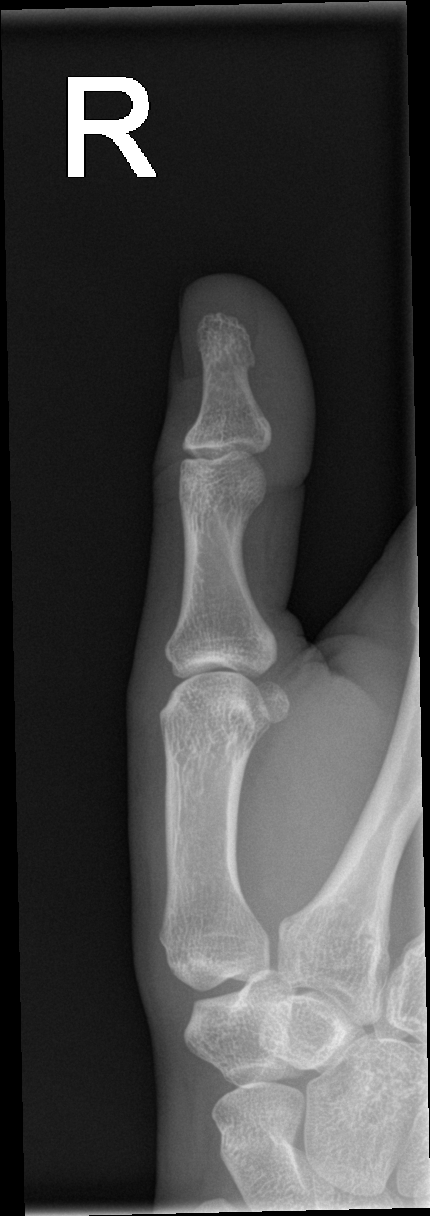

[finger lat]
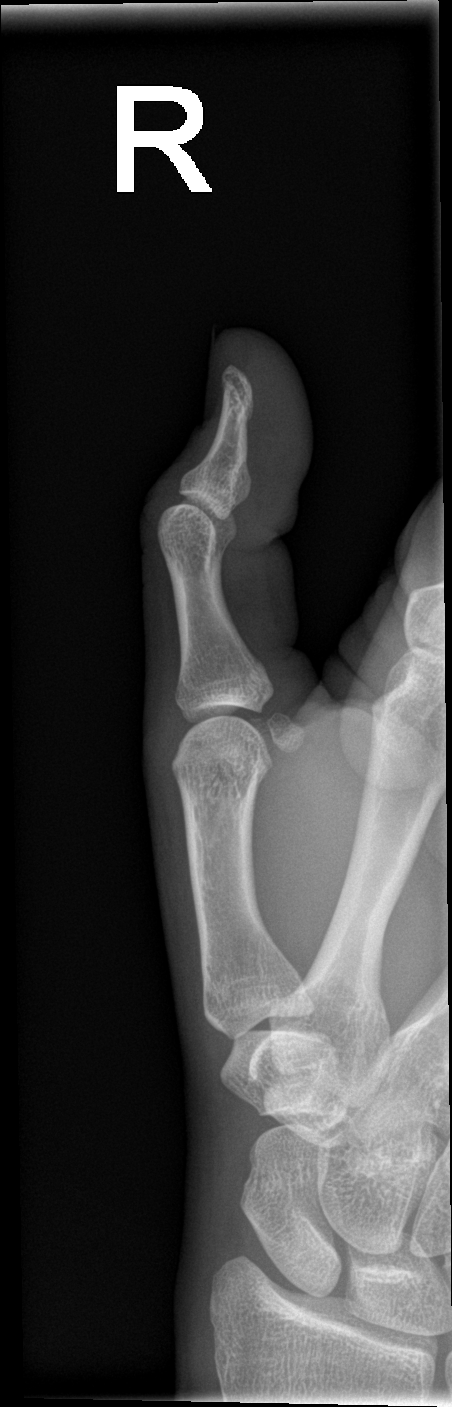

[3 of 3 positions shown; findings below may reference images not displayed]

FINDINGS: There is no evidence of fracture or dislocation. There is no
evidence of arthropathy or other focal bone abnormality. Soft
tissues are unremarkable
IMPRESSION: Negative.

## 2018-12-20 ENCOUNTER — Emergency Department (HOSPITAL_BASED_OUTPATIENT_CLINIC_OR_DEPARTMENT_OTHER)
Admission: EM | Admit: 2018-12-20 | Discharge: 2018-12-20 | Disposition: A | Discharge status: Home / Self Care | Payer: Medicaid Other | Attending: Emergency Medicine | Admitting: Emergency Medicine

## 2018-12-20 ENCOUNTER — Emergency Department (HOSPITAL_BASED_OUTPATIENT_CLINIC_OR_DEPARTMENT_OTHER): Payer: Medicaid Other

## 2018-12-20 ENCOUNTER — Other Ambulatory Visit: Payer: Self-pay

## 2018-12-20 ENCOUNTER — Encounter (HOSPITAL_BASED_OUTPATIENT_CLINIC_OR_DEPARTMENT_OTHER): Payer: Self-pay | Admitting: Emergency Medicine

## 2018-12-20 DIAGNOSIS — N83202 Unspecified ovarian cyst, left side: Secondary | ICD-10-CM | POA: Insufficient documentation

## 2018-12-20 DIAGNOSIS — Z87891 Personal history of nicotine dependence: Secondary | ICD-10-CM | POA: Insufficient documentation

## 2018-12-20 DIAGNOSIS — R102 Pelvic and perineal pain: Secondary | ICD-10-CM | POA: Diagnosis not present

## 2018-12-20 DIAGNOSIS — R1030 Lower abdominal pain, unspecified: Secondary | ICD-10-CM | POA: Diagnosis present

## 2018-12-20 HISTORY — DX: Calculus of kidney: N20.0

## 2018-12-20 LAB — CBC WITH DIFFERENTIAL/PLATELET
Abs Immature Granulocytes: 0.01 10*3/uL (ref 0.00–0.07)
Basophils Absolute: 0 10*3/uL (ref 0.0–0.1)
Basophils Relative: 0 %
Eosinophils Absolute: 0.1 10*3/uL (ref 0.0–0.5)
Eosinophils Relative: 3 %
HCT: 40.8 % (ref 36.0–46.0)
Hemoglobin: 13.3 g/dL (ref 12.0–15.0)
IMMATURE GRANULOCYTES: 0 %
Lymphocytes Relative: 32 %
Lymphs Abs: 1.7 10*3/uL (ref 0.7–4.0)
MCH: 29.9 pg (ref 26.0–34.0)
MCHC: 32.6 g/dL (ref 30.0–36.0)
MCV: 91.7 fL (ref 80.0–100.0)
Monocytes Absolute: 0.4 10*3/uL (ref 0.1–1.0)
Monocytes Relative: 8 %
NEUTROS ABS: 2.9 10*3/uL (ref 1.7–7.7)
NEUTROS PCT: 57 %
PLATELETS: 223 10*3/uL (ref 150–400)
RBC: 4.45 MIL/uL (ref 3.87–5.11)
RDW: 11.9 % (ref 11.5–15.5)
WBC: 5.2 10*3/uL (ref 4.0–10.5)
nRBC: 0 % (ref 0.0–0.2)

## 2018-12-20 LAB — COMPREHENSIVE METABOLIC PANEL
ALT: 15 U/L (ref 0–44)
ANION GAP: 6 (ref 5–15)
AST: 14 U/L — ABNORMAL LOW (ref 15–41)
Albumin: 4.3 g/dL (ref 3.5–5.0)
Alkaline Phosphatase: 45 U/L (ref 38–126)
BUN: 11 mg/dL (ref 6–20)
CO2: 26 mmol/L (ref 22–32)
Calcium: 9.3 mg/dL (ref 8.9–10.3)
Chloride: 106 mmol/L (ref 98–111)
Creatinine, Ser: 0.75 mg/dL (ref 0.44–1.00)
GFR calc Af Amer: >60 mL/min (ref >60)
GFR calc non Af Amer: >60 mL/min (ref >60)
GLUCOSE: 95 mg/dL (ref 70–99)
Potassium: 3.8 mmol/L (ref 3.5–5.1)
Sodium: 138 mmol/L (ref 135–145)
Total Bilirubin: 1.3 mg/dL — ABNORMAL HIGH (ref 0.3–1.2)
Total Protein: 7.2 g/dL (ref 6.5–8.1)

## 2018-12-20 LAB — URINALYSIS, ROUTINE W REFLEX MICROSCOPIC
Bilirubin Urine: NEGATIVE
Glucose, UA: NEGATIVE mg/dL
Hgb urine dipstick: NEGATIVE
Ketones, ur: NEGATIVE mg/dL
Leukocytes, UA: NEGATIVE
Nitrite: NEGATIVE
Protein, ur: NEGATIVE mg/dL
Specific Gravity, Urine: 1.015 (ref 1.005–1.030)
pH: 8.5 — ABNORMAL HIGH (ref 5.0–8.0)

## 2018-12-20 LAB — WET PREP, GENITAL
Sperm: NONE SEEN
Trich, Wet Prep: NONE SEEN
Yeast Wet Prep HPF POC: NONE SEEN

## 2018-12-20 LAB — PREGNANCY, URINE: Preg Test, Ur: NEGATIVE

## 2018-12-20 LAB — LIPASE, BLOOD: Lipase: 36 U/L (ref 11–51)

## 2018-12-20 MED ORDER — SODIUM CHLORIDE 0.9 % IV BOLUS
500.0000 mL | Freq: Once | INTRAVENOUS | Status: AC
  Administered 2018-12-20: 500 mL via INTRAVENOUS

## 2018-12-20 NOTE — ED Provider Notes (Signed)
[facility] EMERGENCY DEPARTMENT Provider Note   CSN: [id number] Arrival date & time: 12/20/18  [id number]     History   Chief Complaint Chief Complaint  Patient presents with  . Abdominal Pain    HPI Kelsey Day is a 33 y.o. female.  The history is provided by the patient and medical records. No language interpreter was used.  Abdominal Pain   Kelsey Day is a 33 y.o. female who presents to the Emergency Department complaining of abdominal pain. He presents to the emergency department complaining of lower abdominal pain that is severe and cramping in nature. It began this morning about 7 o'clock. She denies any fevers, nausea, vomiting, dysuria, vaginal discharge. She is sexually active with the same partner for the last two years. She does not use protection. LMP was last week. She was seen by her PCP one week ago for malaise with epigastric pain and poor appetite with abdominal bloating. She had labs performed at that time, with mild elevation in her bilirubin. Those symptoms have since resolved. There is no medical problems and takes no medications. No prior surgeries. She does have a history of kidney stones and ovarian cyst as well as UTI.  Sxs today are different compared to prior illnesses.  Past Medical History:  Diagnosis Date  . Anxiety   . Kidney stone   . Ovarian cyst   . UTI (lower urinary tract infection)     There are no active problems to display for this patient.   Past Surgical History:  Procedure Laterality Date  . NO PAST SURGERIES    . TYMPANOSTOMY TUBE PLACEMENT     age 89     OB History    Gravida  2   Para  2   Term  2   Preterm      AB      Living  2     SAB      TAB      Ectopic      Multiple      Live Births               Home Medications    Prior to Admission medications   Not on File    Family History No family history on file.  Social History Social History   Tobacco Use  . Smoking status:  Former Smoker    Packs/Day: 0.50    Last attempt to quit: 12/20/2008    Years since quitting: 10.0  . Smokeless tobacco: Never Used  Substance Use Topics  . Alcohol use: Yes    Comment: occassionally  . Drug use: No     Allergies   Patient has no known allergies.   Review of Systems Review of Systems  Gastrointestinal: Positive for abdominal pain.  All other systems reviewed and are negative.    Physical Exam Updated Vital Signs BP 115/75 (BP Location: Right Arm)   Pulse 70   Temp 98.4 F (36.9 C) (Oral)   Resp 16   Ht 5\' 6"  (1.676 m)   Wt 62.1 kg   LMP 12/13/2018   SpO2 100%   BMI 22.11 kg/m   Physical Exam Vitals signs and nursing note reviewed.  Constitutional:      Appearance: She is well-developed.  HENT:     Head: Normocephalic and atraumatic.  Cardiovascular:     Rate and Rhythm: Normal rate and regular rhythm.     Heart sounds: No murmur.  Pulmonary:  Effort: Pulmonary effort is normal. No respiratory distress.     Breath sounds: Normal breath sounds.  Abdominal:     Palpations: Abdomen is soft.     Tenderness: There is no guarding or rebound.     Comments: Moderate generalized abdominal tenderness, greatest over the lower abdomen  Genitourinary:    Comments: Moderate mucoid white/yellow vaginal discharge.  No CMT or adnexal tenderness Musculoskeletal:        General: No tenderness.  Skin:    General: Skin is warm and dry.  Neurological:     Mental Status: She is alert and oriented to person, place, and time.  Psychiatric:        Behavior: Behavior normal.      ED Treatments / Results  Labs (all labs ordered are listed, but only abnormal results are displayed) Labs Reviewed  WET PREP, GENITAL - Abnormal; Notable for the following components:      Result Value   Clue Cells Wet Prep HPF POC PRESENT (*)    WBC, Wet Prep HPF POC MANY (*)    All other components within normal limits  URINALYSIS, ROUTINE W REFLEX MICROSCOPIC - Abnormal;  Notable for the following components:   APPearance HAZY (*)    pH 8.5 (*)    All other components within normal limits  COMPREHENSIVE METABOLIC PANEL - Abnormal; Notable for the following components:   AST 14 (*)    Total Bilirubin 1.3 (*)    All other components within normal limits  PREGNANCY, URINE  CBC WITH DIFFERENTIAL/PLATELET  LIPASE, BLOOD  GC/CHLAMYDIA PROBE AMP ([place]) NOT AT [facility]    EKG None  Radiology [place] Transvaginal Non-ob  Result Date: 12/20/2018 CLINICAL DATA:  Pelvic pain beginning this morning. Clinical suspicion for ovarian torsion. EXAM: TRANSABDOMINAL AND TRANSVAGINAL ULTRASOUND OF PELVIS DOPPLER ULTRASOUND OF OVARIES TECHNIQUE: Both transabdominal and transvaginal ultrasound examinations of the pelvis were performed. Transabdominal technique was performed for global imaging of the pelvis including uterus, ovaries, adnexal regions, and pelvic cul-de-sac. It was necessary to proceed with endovaginal exam following the transabdominal exam to visualize the endometrium and ovaries. Color and duplex Doppler ultrasound was utilized to evaluate blood flow to the ovaries. COMPARISON:  None. FINDINGS: Uterus Measurements: 8.4 x 4.7 x 5.5 cm = volume: 113 mL. No fibroids or other mass visualized. Endometrium Thickness: 6 mm.  No focal abnormality visualized. Right ovary Measurements: 3.3 x 2.1 x 3.2 cm = volume: 11.7 mL. Normal appearance/no adnexal mass. Left ovary Measurements: 4.9 x 1.9 x 3.3 cm = volume: 16.2 mL. A benign-appearing hemorrhagic cyst is seen measuring 3.4 x 1.3 x 3.2 cm. Pulsed Doppler evaluation of both ovaries demonstrates normal low-resistance arterial and venous waveforms. Other findings Small amount of simple free fluid seen in cul-de-sac. IMPRESSION: 3.4 cm benign-appearing hemorrhagic cyst in left ovary, and small amount of free fluid. Normal appearance of uterus and right ovary. No sonographic evidence for ovarian torsion. Electronically Signed   By:  Myles RosenthalJohn  Stahl M.D.   On: 12/20/2018 11:07   [place] Pelvis Complete  Result Date: 12/20/2018 CLINICAL DATA:  Pelvic pain beginning this morning. Clinical suspicion for ovarian torsion. EXAM: TRANSABDOMINAL AND TRANSVAGINAL ULTRASOUND OF PELVIS DOPPLER ULTRASOUND OF OVARIES TECHNIQUE: Both transabdominal and transvaginal ultrasound examinations of the pelvis were performed. Transabdominal technique was performed for global imaging of the pelvis including uterus, ovaries, adnexal regions, and pelvic cul-de-sac. It was necessary to proceed with endovaginal exam following the transabdominal exam to visualize the endometrium and ovaries. Color  and duplex Doppler ultrasound was utilized to evaluate blood flow to the ovaries. COMPARISON:  None. FINDINGS: Uterus Measurements: 8.4 x 4.7 x 5.5 cm = volume: 113 mL. No fibroids or other mass visualized. Endometrium Thickness: 6 mm.  No focal abnormality visualized. Right ovary Measurements: 3.3 x 2.1 x 3.2 cm = volume: 11.7 mL. Normal appearance/no adnexal mass. Left ovary Measurements: 4.9 x 1.9 x 3.3 cm = volume: 16.2 mL. A benign-appearing hemorrhagic cyst is seen measuring 3.4 x 1.3 x 3.2 cm. Pulsed Doppler evaluation of both ovaries demonstrates normal low-resistance arterial and venous waveforms. Other findings Small amount of simple free fluid seen in cul-de-sac. IMPRESSION: 3.4 cm benign-appearing hemorrhagic cyst in left ovary, and small amount of free fluid. Normal appearance of uterus and right ovary. No sonographic evidence for ovarian torsion. Electronically Signed   By: Myles Rosenthal M.D.   On: 12/20/2018 11:07   [place] Art/ven Flow Abd Pelv Doppler  Result Date: 12/20/2018 CLINICAL DATA:  Pelvic pain beginning this morning. Clinical suspicion for ovarian torsion. EXAM: TRANSABDOMINAL AND TRANSVAGINAL ULTRASOUND OF PELVIS DOPPLER ULTRASOUND OF OVARIES TECHNIQUE: Both transabdominal and transvaginal ultrasound examinations of the pelvis were performed. Transabdominal  technique was performed for global imaging of the pelvis including uterus, ovaries, adnexal regions, and pelvic cul-de-sac. It was necessary to proceed with endovaginal exam following the transabdominal exam to visualize the endometrium and ovaries. Color and duplex Doppler ultrasound was utilized to evaluate blood flow to the ovaries. COMPARISON:  None. FINDINGS: Uterus Measurements: 8.4 x 4.7 x 5.5 cm = volume: 113 mL. No fibroids or other mass visualized. Endometrium Thickness: 6 mm.  No focal abnormality visualized. Right ovary Measurements: 3.3 x 2.1 x 3.2 cm = volume: 11.7 mL. Normal appearance/no adnexal mass. Left ovary Measurements: 4.9 x 1.9 x 3.3 cm = volume: 16.2 mL. A benign-appearing hemorrhagic cyst is seen measuring 3.4 x 1.3 x 3.2 cm. Pulsed Doppler evaluation of both ovaries demonstrates normal low-resistance arterial and venous waveforms. Other findings Small amount of simple free fluid seen in cul-de-sac. IMPRESSION: 3.4 cm benign-appearing hemorrhagic cyst in left ovary, and small amount of free fluid. Normal appearance of uterus and right ovary. No sonographic evidence for ovarian torsion. Electronically Signed   By: Myles Rosenthal M.D.   On: 12/20/2018 11:07    Procedures Procedures (including critical care time)  Medications Ordered in ED Medications  sodium chloride 0.9 % bolus 500 mL (0 mLs Intravenous Stopped 12/20/18 1024)     Initial Impression / Assessment and Plan / ED Course  I have reviewed the triage vital signs and the nursing notes.  Pertinent labs & imaging results that were available during my care of the patient were reviewed by me and considered in my medical decision making (see chart for details).     Patient here for evaluation of lower abdominal pain that began earlier today. She does have mild tenderness without peritoneal findings on examination. Pelvic examination with slight vaginally discharge, not consistent with PID, tube ovarian abscess, torsion.  Pelvic ultrasound demonstrates hemorrhagic left ovarian cyst. Current presentation is not consistent with acute appendicitis, bowel obstruction, cholecystitis. Counseled patient on home care for ovarian cyst. Discussed close return precautions if she has worsening pain or new concerning symptoms. Outpatient follow-up discussed.  Final Clinical Impressions(s) / ED Diagnoses   Final diagnoses:  Cyst of left ovary    ED Discharge Orders    None       Tilden Fossa, MD 12/21/18 1000

## 2018-12-20 NOTE — ED Triage Notes (Signed)
Suprapubic pain that started this morning. Denies vaginal issues.  Denies dysuria or frequency.  Sts she was seen by pmd last week for abd pain and they tried to call her yesterday for something but she missed the call.

## 2018-12-20 NOTE — Discharge Instructions (Signed)
You have a 3.4 cm cyst on your left ovary.  You can take ibuprofen or tylenol, available over the counter according to label instructions as needed for pain.  Get rechecked immediately if you develop fevers, change in your pain, or new concerning symptoms.

## 2018-12-21 LAB — GC/CHLAMYDIA PROBE AMP (~~LOC~~) NOT AT ARMC
Chlamydia: NEGATIVE
Neisseria Gonorrhea: NEGATIVE

## 2019-01-01 ENCOUNTER — Other Ambulatory Visit: Payer: Self-pay

## 2019-01-01 ENCOUNTER — Encounter (HOSPITAL_BASED_OUTPATIENT_CLINIC_OR_DEPARTMENT_OTHER): Payer: Self-pay | Admitting: Emergency Medicine

## 2019-01-01 ENCOUNTER — Emergency Department (HOSPITAL_BASED_OUTPATIENT_CLINIC_OR_DEPARTMENT_OTHER)
Admission: EM | Admit: 2019-01-01 | Discharge: 2019-01-01 | Disposition: A | Discharge status: Home / Self Care | Payer: Medicaid Other | Attending: Emergency Medicine | Admitting: Emergency Medicine

## 2019-01-01 DIAGNOSIS — Z87891 Personal history of nicotine dependence: Secondary | ICD-10-CM | POA: Diagnosis not present

## 2019-01-01 DIAGNOSIS — H6502 Acute serous otitis media, left ear: Secondary | ICD-10-CM

## 2019-01-01 DIAGNOSIS — H9202 Otalgia, left ear: Secondary | ICD-10-CM | POA: Diagnosis present

## 2019-01-01 MED ORDER — AMOXICILLIN 500 MG PO CAPS: 1000.0000 mg | ORAL_CAPSULE | Freq: Two times a day (BID) | ORAL | 0 refills | Status: AC

## 2019-01-01 NOTE — Discharge Instructions (Signed)
There appears to be evidence of infection in the left ear. Please take all of your antibiotics until finished!   You may develop abdominal discomfort or diarrhea from the antibiotic.  You may help offset this with probiotics which you can buy or get in yogurt. Do not eat or take the probiotics until 2 hours after your antibiotic.   Drink plenty of water to stay well-hydrated.  Antiinflammatory medications: Take 600 mg of ibuprofen every 6 hours or 440 mg (over the counter dose) to 500 mg (prescription dose) of naproxen every 12 hours for the next 3 days. After this time, these medications may be used as needed for pain. Take these medications with food to avoid upset stomach. Choose only one of these medications, do not take them together. Acetaminophen (generic for Tylenol): Should you continue to have additional pain while taking the ibuprofen or naproxen, you may add in acetaminophen as needed. Your daily total maximum amount of acetaminophen from all sources should be limited to 4000mg /day for persons without liver problems, or 2000mg /day for those with liver problems.

## 2019-01-01 NOTE — ED Triage Notes (Signed)
L ear pain and sore throat since yesterday.

## 2019-01-01 NOTE — ED Provider Notes (Signed)
[facility] EMERGENCY DEPARTMENT Provider Note   CSN: [id number] Arrival date & time: 01/01/19  [id number]     History   Chief Complaint Chief Complaint  Patient presents with  . Otalgia  . Sore Throat    HPI Kelsey Day is a 33 y.o. female.  HPI   Kelsey Day is a 33 y.o. female, with a history of anxiety, presenting to the ED with left ear pain beginning yesterday.  Feels like an aching, moderate to severe, nonradiating.  Also notes nasal congestion and sore throat beginning this morning.  Denies fever, ear drainage, cough, shortness of breath, facial swelling or pain, vomiting, or any other complaints.    Past Medical History:  Diagnosis Date  . Anxiety   . Kidney stone   . Ovarian cyst   . UTI (lower urinary tract infection)     There are no active problems to display for this patient.   Past Surgical History:  Procedure Laterality Date  . NO PAST SURGERIES    . TYMPANOSTOMY TUBE PLACEMENT     age 89     OB History    Gravida  2   Para  2   Term  2   Preterm      AB      Living  2     SAB      TAB      Ectopic      Multiple      Live Births               Home Medications    Prior to Admission medications   Medication Sig Start Date End Date Taking? Authorizing Provider  amoxicillin (AMOXIL) 500 MG capsule Take 2 capsules (1,000 mg total) by mouth 2 (two) times daily for 7 days. 01/01/19 01/08/19  Anselm PancoastJoy, Verley Pariseau C, PA-C    Family History No family history on file.  Social History Social History   Tobacco Use  . Smoking status: Former Smoker    Packs/day: 0.50    Last attempt to quit: 12/20/2008    Years since quitting: 10.0  . Smokeless tobacco: Never Used  Substance Use Topics  . Alcohol use: Yes    Comment: occassionally  . Drug use: No     Allergies   Patient has no known allergies.   Review of Systems Review of Systems  Constitutional: Negative for chills and fever.  HENT: Positive for congestion,  ear pain and sore throat. Negative for ear discharge, facial swelling, trouble swallowing and voice change.   Respiratory: Negative for cough and shortness of breath.   Cardiovascular: Negative for chest pain.  Gastrointestinal: Negative for diarrhea, nausea and vomiting.  Musculoskeletal: Negative for neck pain and neck stiffness.  Neurological: Negative for syncope and headaches.  All other systems reviewed and are negative.    Physical Exam Updated Vital Signs BP 104/71 (BP Location: Left Arm)   Pulse 88   Temp 98 F (36.7 C) (Oral)   Resp 16   Ht 5\' 6"  (1.676 m)   Wt 62.6 kg   LMP 12/13/2018   SpO2 99%   BMI 22.27 kg/m   Physical Exam Vitals signs and nursing note reviewed.  Constitutional:      General: She is not in acute distress.    Appearance: She is well-developed. She is not diaphoretic.  HENT:     Head: Normocephalic and atraumatic.     Right Ear: Tympanic membrane, ear canal and external ear  normal.     Left Ear: Ear canal and external ear normal. Tympanic membrane is erythematous and bulging.     Nose: Nose normal.     Mouth/Throat:     Mouth: Mucous membranes are moist.     Pharynx: Oropharynx is clear.  Eyes:     Conjunctiva/sclera: Conjunctivae normal.  Neck:     Musculoskeletal: Neck supple.  Cardiovascular:     Rate and Rhythm: Normal rate and regular rhythm.     Pulses: Normal pulses.  Pulmonary:     Effort: Pulmonary effort is normal. No respiratory distress.  Abdominal:     Tenderness: There is no guarding.  Musculoskeletal:     Right lower leg: No edema.     Left lower leg: No edema.  Lymphadenopathy:     Cervical: No cervical adenopathy.  Skin:    General: Skin is warm and dry.  Neurological:     Mental Status: She is alert.  Psychiatric:        Mood and Affect: Mood and affect normal.        Speech: Speech normal.        Behavior: Behavior normal.      ED Treatments / Results  Labs (all labs ordered are listed, but only  abnormal results are displayed) Labs Reviewed - No data to display  EKG None  Radiology No results found.  Procedures Procedures (including critical care time)  Medications Ordered in ED Medications - No data to display   Initial Impression / Assessment and Plan / ED Course  I have reviewed the triage vital signs and the nursing notes.  Pertinent labs & imaging results that were available during my care of the patient were reviewed by me and considered in my medical decision making (see chart for details).     Patient presents with primarily left ear pain.  Evidence of otitis media.  Antibiotic therapy initiated. The patient was given instructions for home care as well as return precautions. Patient voices understanding of these instructions, accepts the plan, and is comfortable with discharge.  Final Clinical Impressions(s) / ED Diagnoses   Final diagnoses:  Non-recurrent acute serous otitis media of left ear    ED Discharge Orders         Ordered    amoxicillin (AMOXIL) 500 MG capsule  2 times daily     01/01/19 1049           Anselm PancoastJoy, Crystal Scarberry C, PA-C 01/01/19 1053    Derwood KaplanNanavati, Ankit, MD 01/03/19 [phone]

## 2020-11-05 ENCOUNTER — Emergency Department (HOSPITAL_BASED_OUTPATIENT_CLINIC_OR_DEPARTMENT_OTHER)
Admission: EM | Admit: 2020-11-05 | Discharge: 2020-11-05 | Disposition: A | Discharge status: Home / Self Care | Payer: Medicaid Other | Attending: Emergency Medicine | Admitting: Emergency Medicine

## 2020-11-05 ENCOUNTER — Other Ambulatory Visit: Payer: Self-pay

## 2020-11-05 ENCOUNTER — Encounter (HOSPITAL_BASED_OUTPATIENT_CLINIC_OR_DEPARTMENT_OTHER): Payer: Self-pay

## 2020-11-05 DIAGNOSIS — M546 Pain in thoracic spine: Secondary | ICD-10-CM | POA: Insufficient documentation

## 2020-11-05 DIAGNOSIS — Z87891 Personal history of nicotine dependence: Secondary | ICD-10-CM | POA: Diagnosis not present

## 2020-11-05 MED ORDER — CYCLOBENZAPRINE HCL 10 MG PO TABS: 10.0000 mg | ORAL_TABLET | Freq: Three times a day (TID) | ORAL | 0 refills | Status: DC | PRN

## 2020-11-05 MED ORDER — DEXAMETHASONE 6 MG PO TABS
10.0000 mg | ORAL_TABLET | Freq: Once | ORAL | Status: AC
  Administered 2020-11-05: 10 mg via ORAL
  Filled 2020-11-05: qty 1

## 2020-11-05 MED ORDER — CYCLOBENZAPRINE HCL 10 MG PO TABS: 10.0000 mg | ORAL_TABLET | Freq: Two times a day (BID) | ORAL | 0 refills | Status: DC | PRN

## 2020-11-05 MED ORDER — KETOROLAC TROMETHAMINE 60 MG/2ML IM SOLN
60.0000 mg | Freq: Once | INTRAMUSCULAR | Status: AC
  Administered 2020-11-05: 60 mg via INTRAMUSCULAR
  Filled 2020-11-05: qty 2

## 2020-11-05 MED ORDER — LIDOCAINE 5 % EX PTCH: 1.0000 | MEDICATED_PATCH | CUTANEOUS | 0 refills | Status: DC

## 2020-11-05 NOTE — Discharge Instructions (Signed)
Take Tylenol 1000 mg 4 times a day for 1 week. This is the maximum dose of Tylenol (acetaminophen) you can take from all sources. Please check other over-the-counter medications and prescriptions to ensure you are not taking other medications that contain acetaminophen.  You may also take ibuprofen 400 mg 6 times a day alternating with or at the same time as tylenol.  You can take the prescribed flexeril up to 3 times daily as needed (and can be at the same time as tylenol or ibuprofen.)

## 2020-11-05 NOTE — ED Notes (Signed)
Pt given IM injection and stated directly after that she was dizzy and felt nauseated. Pt encouraged to lay on bed for a few minutes and will recheck.

## 2020-11-05 NOTE — ED Notes (Signed)
ED Provider at bedside. 

## 2020-11-05 NOTE — ED Provider Notes (Signed)
[facility] EMERGENCY DEPARTMENT Provider Note   CSN: [id number] Arrival date & time: 11/05/20  4081     History Chief Complaint  Patient presents with  . Back Pain    Kelsey Day is a 34 y.o. female.  HPI      34 year old female who presents with acute right-sided back pain after bending over to pick something up off of a pallet at work.  Reports that on Friday she leaned over to pick something up off of a pallet, and suddenly felt pain in the middle right side of her back. Reports she immediately noted the pain, and avoided picking things up for the rest of the day. When she got home, she put ice on it and the pain was moderate. When she woke up the next morning, the pain was more severe. She has noted each day that she is woken up the pain has worsened, and this morning found the pain to be unbearable. The pain is severe, worse with movements, difficulty getting out of bed today. Describes it as knifelike pain in her back right lower ribs. The pain does not radiate down her legs. Denies numbness, weakness, loss of control of bowel or bladder. Denies abdominal pain, dysuria, fevers, chest pain, cough shortness of breath. No history of trauma, cancer or IV drug use. Denies possibility of pregnancy  Past Medical History:  Diagnosis Date  . Anxiety   . Kidney stone   . Ovarian cyst   . UTI (lower urinary tract infection)     There are no problems to display for this patient.   Past Surgical History:  Procedure Laterality Date  . NO PAST SURGERIES    . TYMPANOSTOMY TUBE PLACEMENT     age 13     OB History    Gravida  2   Para  2   Term  2   Preterm      AB      Living  2     SAB      IAB      Ectopic      Multiple      Live Births              History reviewed. No pertinent family history.  Social History   Tobacco Use  . Smoking status: Former Smoker    Packs/day: 0.50    Quit date: 12/20/2008    Years since quitting: 11.8   . Smokeless tobacco: Never Used  Substance Use Topics  . Alcohol use: Yes    Comment: occassionally  . Drug use: No    Home Medications Prior to Admission medications   Medication Sig Start Date End Date Taking? Authorizing Provider  cyclobenzaprine (FLEXERIL) 10 MG tablet Take 1 tablet (10 mg total) by mouth 3 (three) times daily as needed for muscle spasms. 11/05/20   Alvira Monday, MD  lidocaine (LIDODERM) 5 % Place 1 patch onto the skin daily. Remove & Discard patch within 12 hours or as directed by MD 11/05/20   Alvira Monday, MD    Allergies    Patient has no known allergies.  Review of Systems   Review of Systems  Constitutional: Negative for fever.  Respiratory: Negative for cough and shortness of breath.   Cardiovascular: Negative for chest pain.  Gastrointestinal: Negative for abdominal pain, nausea and vomiting.  Genitourinary: Negative for difficulty urinating and dysuria.  Musculoskeletal: Positive for back pain.  Skin: Negative for rash.  Neurological: Negative for weakness  and numbness.    Physical Exam Updated Vital Signs BP 117/79 (BP Location: Right Arm)   Pulse 72   Temp 97.8 F (36.6 C) (Oral)   Resp 18   Ht 5\' 6"  (1.676 m)   Wt 65.8 kg   LMP 11/01/2020   SpO2 100%   BMI 23.40 kg/m   Physical Exam Vitals and nursing note reviewed.  Constitutional:      General: She is not in acute distress.    Appearance: Normal appearance. She is not ill-appearing, toxic-appearing or diaphoretic.  HENT:     Head: Normocephalic.  Eyes:     Conjunctiva/sclera: Conjunctivae normal.  Cardiovascular:     Rate and Rhythm: Normal rate and regular rhythm.     Pulses: Normal pulses.  Pulmonary:     Effort: Pulmonary effort is normal. No respiratory distress.  Abdominal:     General: Abdomen is flat.     Tenderness: There is no abdominal tenderness.  Musculoskeletal:        General: Tenderness (right low thoracic--lumbar back) present. No deformity or  signs of injury.  Skin:    General: Skin is warm and dry.     Coloration: Skin is not jaundiced or pale.  Neurological:     General: No focal deficit present.     Mental Status: She is alert and oriented to person, place, and time.     Comments: 5/5 strength lower extremities (pain with movement of right), hip flexion/knee flex/ext/plantar/dorsi flex, normal sensation     ED Results / Procedures / Treatments   Labs (all labs ordered are listed, but only abnormal results are displayed) Labs Reviewed - No data to display  EKG None  Radiology No results found.  Procedures Procedures (including critical care time)  Medications Ordered in ED Medications  ketorolac (TORADOL) injection 60 mg (has no administration in time range)  dexamethasone (DECADRON) tablet 10 mg (has no administration in time range)    ED Course  I have reviewed the triage vital signs and the nursing notes.  Pertinent labs & imaging results that were available during my care of the patient were reviewed by me and considered in my medical decision making (see chart for details).    MDM Rules/Calculators/A&P                          34 year old female who presents with acute right-sided back pain after bending over to pick something up off of a pallet at work. Patient has a normal neurologic exam and denies any urinary retention or overflow incontinence, stool incontinence, saddle anesthesia, fever, IV drug use, trauma, chronic steroid use or immunocompromise and have low suspicion suspicion for cauda equina, fracture, epidural abscess, or vertebral osteomyelitis.  No abdominal pain or tenderness, pain not colicky doubt intraabdominal etiology or nephrolithiasis.  Normal bilateral lower pulses.  Doubt spontaneous rib fx, PE, pneumonia by hx and exam.  Suspect likely msk etiology of back pain, muscle strain or herniated disc. Given toradol and decadron in ED, rx for flexeril and lidoderm and rec tylenol/ibuprofen  and PCP follow up. Patient discharged in stable condition with understanding of reasons to return.     Final Clinical Impression(s) / ED Diagnoses Final diagnoses:  Acute right-sided thoracic back pain    Rx / DC Orders ED Discharge Orders         Ordered    cyclobenzaprine (FLEXERIL) 10 MG tablet  2 times daily PRN,   Status:  Discontinued        11/05/20 0758    lidocaine (LIDODERM) 5 %  Every 24 hours        11/05/20 0758    cyclobenzaprine (FLEXERIL) 10 MG tablet  3 times daily PRN        11/05/20 0759           Alvira Monday, MD 11/05/20 [phone]

## 2020-11-05 NOTE — ED Triage Notes (Signed)
Pt complaint of R lower back injury at work on 11/01/2020, pt complaint of R lower back pain. Pt denies changed in bowel/bladder habits.

## 2021-04-06 ENCOUNTER — Other Ambulatory Visit: Payer: Self-pay

## 2021-04-06 ENCOUNTER — Emergency Department (HOSPITAL_BASED_OUTPATIENT_CLINIC_OR_DEPARTMENT_OTHER)
Admission: EM | Admit: 2021-04-06 | Discharge: 2021-04-06 | Disposition: A | Discharge status: Home / Self Care | Payer: Medicaid Other | Attending: Emergency Medicine | Admitting: Emergency Medicine

## 2021-04-06 DIAGNOSIS — R000 Tachycardia, unspecified: Secondary | ICD-10-CM | POA: Insufficient documentation

## 2021-04-06 DIAGNOSIS — B9689 Other specified bacterial agents as the cause of diseases classified elsewhere: Secondary | ICD-10-CM | POA: Diagnosis not present

## 2021-04-06 DIAGNOSIS — N390 Urinary tract infection, site not specified: Secondary | ICD-10-CM | POA: Diagnosis not present

## 2021-04-06 DIAGNOSIS — Z20822 Contact with and (suspected) exposure to covid-19: Secondary | ICD-10-CM | POA: Insufficient documentation

## 2021-04-06 DIAGNOSIS — Z87891 Personal history of nicotine dependence: Secondary | ICD-10-CM | POA: Insufficient documentation

## 2021-04-06 DIAGNOSIS — R519 Headache, unspecified: Secondary | ICD-10-CM | POA: Diagnosis not present

## 2021-04-06 DIAGNOSIS — R110 Nausea: Secondary | ICD-10-CM | POA: Diagnosis present

## 2021-04-06 LAB — URINALYSIS, ROUTINE W REFLEX MICROSCOPIC
Bilirubin Urine: NEGATIVE
Glucose, UA: NEGATIVE mg/dL
Hgb urine dipstick: NEGATIVE
Ketones, ur: NEGATIVE mg/dL
Nitrite: NEGATIVE
Protein, ur: NEGATIVE mg/dL
Specific Gravity, Urine: 1.015 (ref 1.005–1.030)
pH: 8.5 — ABNORMAL HIGH (ref 5.0–8.0)

## 2021-04-06 LAB — URINALYSIS, MICROSCOPIC (REFLEX)

## 2021-04-06 LAB — PREGNANCY, URINE: Preg Test, Ur: NEGATIVE

## 2021-04-06 LAB — SARS CORONAVIRUS 2 (TAT 6-24 HRS): SARS Coronavirus 2: NEGATIVE

## 2021-04-06 MED ORDER — ONDANSETRON 8 MG PO TBDP: 8.0000 mg | ORAL_TABLET | Freq: Three times a day (TID) | ORAL | 0 refills | Status: DC | PRN

## 2021-04-06 MED ORDER — FOSFOMYCIN TROMETHAMINE 3 G PO PACK
3.0000 g | PACK | Freq: Once | ORAL | Status: AC
  Administered 2021-04-06: 3 g via ORAL
  Filled 2021-04-06: qty 3

## 2021-04-06 MED ORDER — ONDANSETRON 4 MG PO TBDP
8.0000 mg | ORAL_TABLET | Freq: Once | ORAL | Status: DC
  Filled 2021-04-06: qty 2

## 2021-04-06 MED ORDER — ACETAMINOPHEN 325 MG PO TABS
650.0000 mg | ORAL_TABLET | Freq: Once | ORAL | Status: AC
  Administered 2021-04-06: 650 mg via ORAL
  Filled 2021-04-06: qty 2

## 2021-04-06 NOTE — ED Provider Notes (Signed)
[facility] Provider Note: Kelsey Dell, MD, FACEP  CSN: [id number] MRN: [id number] ARRIVAL: 04/06/21 at 0426 ROOM: [facility]02/[facility]   CHIEF COMPLAINT  Generalized Body Aches   HISTORY OF PRESENT ILLNESS  04/06/21 4:47 AM Kelsey Day is a 35 y.o. female with about 3 hours of generalized body aches and headache with associated nausea.  She rates her headache as an 8 out of 10, throbbing in nature.  On arrival she was noted to have a temperature of 100.3 with associated tachycardia.  She denies cough, shortness of breath, vomiting, or diarrhea.  She has had some cramping after urination.  Her son recently was sick with a gastrointestinal illness.   Past Medical History:  Diagnosis Date  . Anxiety   . Kidney stone   . Ovarian cyst   . UTI (lower urinary tract infection)     Past Surgical History:  Procedure Laterality Date  . NO PAST SURGERIES    . TYMPANOSTOMY TUBE PLACEMENT     age 43    No family history on file.  Social History   Tobacco Use  . Smoking status: Former Smoker    Packs/day: 0.50    Quit date: 12/20/2008    Years since quitting: 12.3  . Smokeless tobacco: Never Used  Substance Use Topics  . Alcohol use: Yes    Comment: occassionally  . Drug use: No    Prior to Admission medications   Medication Sig Start Date End Date Taking? Authorizing Provider  ondansetron (ZOFRAN ODT) 8 MG disintegrating tablet Take 1 tablet (8 mg total) by mouth every 8 (eight) hours as needed. 04/06/21  Yes Aimee Timmons, MD    Allergies Patient has no known allergies.   REVIEW OF SYSTEMS  Negative except as noted here or in the History of Present Illness.   PHYSICAL EXAMINATION  Initial Vital Signs Blood pressure 119/75, pulse (!) 130, temperature 100.3 F (37.9 C), resp. rate 20, height 5\' 6"  (1.676 m), weight 63.5 kg, last menstrual period 03/17/2021, SpO2 100 %.  Examination General: Well-developed, well-nourished female in no acute distress;  appearance consistent with age of record HENT: normocephalic; atraumatic Eyes: pupils equal, round and reactive to light; extraocular muscles intact Neck: supple Heart: regular rate and rhythm; tachycardia Lungs: clear to auscultation bilaterally Abdomen: soft; nondistended; nontender; bowel sounds present Extremities: No deformity; full range of motion Neurologic: Awake, alert and oriented; motor function intact in all extremities and symmetric; no facial droop Skin: Warm and dry Psychiatric: Normal mood and affect   RESULTS  Summary of this visit's results, reviewed and interpreted by myself:   EKG Interpretation  Date/Time:    Ventricular Rate:    PR Interval:    QRS Duration:   QT Interval:    QTC Calculation:   R Axis:     Text Interpretation:        Laboratory Studies: Results for orders placed or performed during the hospital encounter of 04/06/21 (from the past 24 hour(s))  Urinalysis, Routine w reflex microscopic Nasopharyngeal Swab     Status: Abnormal   Collection Time: 04/06/21  5:07 AM  Result Value Ref Range   Color, Urine YELLOW YELLOW   APPearance CLEAR CLEAR   Specific Gravity, Urine 1.015 1.005 - 1.030   pH 8.5 (H) 5.0 - 8.0   Glucose, UA NEGATIVE NEGATIVE mg/dL   Hgb urine dipstick NEGATIVE NEGATIVE   Bilirubin Urine NEGATIVE NEGATIVE   Ketones, ur NEGATIVE NEGATIVE mg/dL   Protein, ur NEGATIVE  NEGATIVE mg/dL   Nitrite NEGATIVE NEGATIVE   Leukocytes,Ua MODERATE (A) NEGATIVE  Pregnancy, urine     Status: None   Collection Time: 04/06/21  5:07 AM  Result Value Ref Range   Preg Test, Ur NEGATIVE NEGATIVE  Urinalysis, Microscopic (reflex)     Status: Abnormal   Collection Time: 04/06/21  5:07 AM  Result Value Ref Range   RBC / HPF 0-5 0 - 5 RBC/hpf   WBC, UA 11-20 0 - 5 WBC/hpf   Bacteria, UA FEW (A) NONE SEEN   Squamous Epithelial / LPF 11-20 0 - 5   Imaging Studies: No results found.  ED COURSE and MDM  Nursing notes, initial and  subsequent vitals signs, including pulse oximetry, reviewed and interpreted by myself.  Vitals:   04/06/21 0442 04/06/21 0443  BP: 119/75   Pulse: (!) 130   Resp: 20   Temp: 100.3 F (37.9 C)   SpO2: 100%   Weight:  63.5 kg  Height:  5\' 6"  (1.676 m)   Medications  ondansetron (ZOFRAN-ODT) disintegrating tablet 8 mg (0 mg Oral Hold 04/06/21 0515)  fosfomycin (MONUROL) packet 3 g (has no administration in time range)  acetaminophen (TYLENOL) tablet 650 mg (650 mg Oral Given 04/06/21 0514)   5:28 AM In the context of the patient's symptoms the urinalysis is consistent with a urinary tract infection.  We will go ahead and treat with fosfomycin.  COVID test is also pending at this time.  PROCEDURES  Procedures   ED DIAGNOSES     ICD-10-CM   1. Lower urinary tract infectious disease  N39.0        Keyvon Herter, MD 04/06/21 [phone]

## 2021-04-06 NOTE — ED Triage Notes (Signed)
C/o body aches since 0200. (+) nausea (-) fever/chills. Son recently sick w/ "stomach virus."

## 2021-04-07 ENCOUNTER — Emergency Department (HOSPITAL_BASED_OUTPATIENT_CLINIC_OR_DEPARTMENT_OTHER)
Admission: EM | Admit: 2021-04-07 | Discharge: 2021-04-07 | Disposition: A | Discharge status: Home / Self Care | Payer: Medicaid Other | Attending: Emergency Medicine | Admitting: Emergency Medicine

## 2021-04-07 DIAGNOSIS — Z5321 Procedure and treatment not carried out due to patient leaving prior to being seen by health care provider: Secondary | ICD-10-CM | POA: Diagnosis not present

## 2021-04-07 DIAGNOSIS — R519 Headache, unspecified: Secondary | ICD-10-CM | POA: Diagnosis not present

## 2021-04-07 LAB — URINE CULTURE: Culture: NO GROWTH

## 2021-04-07 NOTE — ED Triage Notes (Signed)
Headache. She was seen here yesterday and diagnosed with a UTI. She has had one antibiotic. She took Tylenol 6 hours ago. She took Zofran over 4 hours ago.

## 2021-12-01 ENCOUNTER — Encounter (HOSPITAL_BASED_OUTPATIENT_CLINIC_OR_DEPARTMENT_OTHER): Payer: Self-pay | Admitting: Emergency Medicine

## 2021-12-01 ENCOUNTER — Telehealth (HOSPITAL_BASED_OUTPATIENT_CLINIC_OR_DEPARTMENT_OTHER): Payer: Self-pay | Admitting: Student

## 2021-12-01 ENCOUNTER — Other Ambulatory Visit: Payer: Self-pay

## 2021-12-01 ENCOUNTER — Emergency Department (HOSPITAL_BASED_OUTPATIENT_CLINIC_OR_DEPARTMENT_OTHER)
Admission: EM | Admit: 2021-12-01 | Discharge: 2021-12-01 | Disposition: A | Discharge status: Home / Self Care | Payer: Medicaid Other | Attending: Emergency Medicine | Admitting: Emergency Medicine

## 2021-12-01 DIAGNOSIS — I889 Nonspecific lymphadenitis, unspecified: Secondary | ICD-10-CM | POA: Diagnosis not present

## 2021-12-01 DIAGNOSIS — R220 Localized swelling, mass and lump, head: Secondary | ICD-10-CM | POA: Diagnosis present

## 2021-12-01 DIAGNOSIS — Z20822 Contact with and (suspected) exposure to covid-19: Secondary | ICD-10-CM | POA: Insufficient documentation

## 2021-12-01 LAB — RESP PANEL BY RT-PCR (FLU A&B, COVID) ARPGX2
Influenza A by PCR: NEGATIVE
Influenza B by PCR: NEGATIVE
SARS Coronavirus 2 by RT PCR: NEGATIVE

## 2021-12-01 MED ORDER — SULFAMETHOXAZOLE-TRIMETHOPRIM 800-160 MG PO TABS: 1.0000 | ORAL_TABLET | Freq: Two times a day (BID) | ORAL | 0 refills | Status: AC

## 2021-12-01 NOTE — ED Provider Notes (Signed)
[facility] EMERGENCY DEPARTMENT Provider Note   CSN: [id number] Arrival date & time: 12/01/21  0908     History  Chief Complaint  Patient presents with   Facial Swelling    Kelsey Day is a 36 y.o. female Who presents with concern for 5 days of left-sided facial swelling in front of the left ear.  Area is tender to the touch and achy without palpation.  No redness or fever associated with her symptoms.  Patient denies any known trauma to the area but does endorse some internal left ear discomfort.  Some very mild edema of the left upper eyelid as well without pain in the eye or vision change. She denies any fevers or chills, does endorse some congestion but denies rhinorrhea or sore throat.  No cough.  No GI symptoms.    I have personally reviewed this patient's medical records.  She has history of anxiety, nephrolithiasis.  She is not on any medications daily.  HPI     Home Medications Prior to Admission medications   Not on File      Allergies    Patient has no known allergies.    Review of Systems   Review of Systems  Constitutional: Negative.   HENT:  Positive for congestion, ear pain and facial swelling. Negative for ear discharge, sore throat, trouble swallowing and voice change.   Eyes:  Positive for itching. Negative for photophobia, pain, redness and visual disturbance.  Respiratory: Negative.    Cardiovascular: Negative.   Gastrointestinal: Negative.   Genitourinary: Negative.   Musculoskeletal: Negative.   Neurological: Negative.   Hematological: Negative.    Physical Exam Updated Vital Signs BP 118/87 (BP Location: Right Arm)    Pulse 82    Temp 97.9 F (36.6 C) (Oral)    Resp (!) 22    Ht 5\' 6"  (1.676 m)    Wt 61.2 kg    LMP 11/17/2021    SpO2 96%    BMI 21.79 kg/m  Physical Exam Vitals and nursing note reviewed.  Constitutional:      Appearance: She is not ill-appearing or toxic-appearing.  HENT:     Head: Normocephalic and  atraumatic.      Comments: No temporal tenderness palpation bilaterally.    Right Ear: Tympanic membrane normal. No mastoid tenderness.     Left Ear:  No middle ear effusion. No mastoid tenderness. No hemotympanum. Tympanic membrane is injected. Tympanic membrane is not perforated, erythematous, retracted or bulging.     Nose: Nose normal. No rhinorrhea.     Mouth/Throat:     Mouth: Mucous membranes are moist.     Dentition: Normal dentition. No gingival swelling, dental caries or dental abscesses.     Pharynx: Oropharynx is clear. Uvula midline. No oropharyngeal exudate or posterior oropharyngeal erythema.     Tonsils: No tonsillar exudate.  Eyes:     General: Lids are normal. Vision grossly intact.        Right eye: No discharge.        Left eye: No discharge.     Extraocular Movements: Extraocular movements intact.     Conjunctiva/sclera: Conjunctivae normal.     Pupils: Pupils are equal, round, and reactive to light.      Comments: No ocular pain with EOMs.  Neck:     Trachea: Trachea and phonation normal.   Cardiovascular:     Rate and Rhythm: Normal rate and regular rhythm.     Pulses: Normal pulses.  Heart sounds: Normal heart sounds. No murmur heard. Pulmonary:     Effort: Pulmonary effort is normal. No tachypnea, bradypnea or respiratory distress.     Breath sounds: Normal breath sounds. No wheezing or rales.  Chest:     Chest wall: No mass, lacerations, deformity, swelling, tenderness or crepitus.  Abdominal:     General: Bowel sounds are normal. There is no distension.     Palpations: Abdomen is soft.     Tenderness: There is no abdominal tenderness. There is no guarding or rebound.  Musculoskeletal:        General: No deformity.     Cervical back: Normal range of motion and neck supple. No edema, rigidity, tenderness or crepitus. No pain with movement, spinous process tenderness or muscular tenderness.     Right lower leg: No edema.     Left lower leg: No edema.   Lymphadenopathy:     Cervical: Cervical adenopathy present.     Left cervical: Posterior cervical adenopathy present. No superficial cervical adenopathy.  Skin:    General: Skin is warm and dry.     Capillary Refill: Capillary refill takes less than 2 seconds.     Findings: No rash.  Neurological:     General: No focal deficit present.     Mental Status: She is alert and oriented to person, place, and time. Mental status is at baseline.     GCS: GCS eye subscore is 4. GCS verbal subscore is 5. GCS motor subscore is 6.  Psychiatric:        Mood and Affect: Mood normal.    ED Results / Procedures / Treatments   Labs (all labs ordered are listed, but only abnormal results are displayed) Labs Reviewed  RESP PANEL BY RT-PCR (FLU A&B, COVID) [id number]    EKG None  Radiology No results found.  Procedures Procedures   Medications Ordered in ED Medications - No data to display  ED Course/ Medical Decision Making/ A&P                           Medical Decision Making 36 year old female presents with concern for left-sided facial swelling x5 days.  Tachypneic on intake, resolved at time of my evaluation.  Cardiopulmonary exam is normal, abdominal exam is benign.  Patient with left-sided preauricular and shotty posterior cervical lymphadenopathy that is tender to palpation.  No temporal tenderness to palpation, no ocular pain with EOMs, PERRL, EOMI.  Suspect acute viral URI, however given 5 days of progressively worsening lymphadenopathy and new left ear pain will proceed with antibiotic therapy.  Patient is not on any biotics for the last 90 days.  Doubt periorbital or orbital cellulitis as there is no erythema or induration of the skin around the eye and no pain in the eye itself.  Mild swelling of the left upper eyelid with associated itching, appears more allergic in nature than cellulitic.  Respiratory pathogen panel is pending though the patient's child does have COVID-19.  No  further work-up is warranted in the ER at this time.  Discharge is appropriate given patient's reassuring vital signs and physical exam.  Suspect etiologies patient symptoms is lymphadenitis which we will cover with antibiotics at this time.  Ajla voiced understanding of her medical evaluation and treatment plan.  Each of her questions was answered to her expressed satisfaction.  Return precautions were given.  Patient is well-appearing, stable, and appropriate for discharge at this time.  This  chart was dictated using voice recognition software, Dragon. Despite the best efforts of this provider to proofread and correct errors, errors may still occur which can change documentation meaning.   Final Clinical Impression(s) / ED Diagnoses Final diagnoses:  Lymphadenitis    Rx / DC Orders ED Discharge Orders     None         Emeline Darling, PA-C 12/01/21 1114    Malvin Johns, MD 12/01/21 1447

## 2021-12-01 NOTE — Discharge Instructions (Signed)
You are seen in the ER today for the swelling from the left ear.  This is most consistent lymphadenitis, which is potentially bacterial infection.  You have been prescribed antibiotics to take twice a day for the next week.  Please complete this as prescribed for entire course.  Additionally it is possible your test positive for COVID-19 given your son did.  You have been provided a work note and should avoid social interactions for the next week.  Tylenol and ibuprofen as needed for discomfort; return to the ER with any new severe symptoms.

## 2021-12-01 NOTE — Telephone Encounter (Signed)
Telephone encounter created to change pharmacy requested by patient for antibiotic prescription.  This chart was dictated using voice recognition software, Dragon. Despite the best efforts of this provider to proofread and correct errors, errors may still occur which can change documentation meaning.

## 2021-12-01 NOTE — ED Triage Notes (Signed)
Left sided facial pain and swelling since Thursday.  No known trauma or fever.  No dental issues.

## 2022-11-09 ENCOUNTER — Emergency Department (HOSPITAL_BASED_OUTPATIENT_CLINIC_OR_DEPARTMENT_OTHER)
Admission: EM | Admit: 2022-11-09 | Discharge: 2022-11-09 | Disposition: A | Discharge status: Home / Self Care | Payer: Medicaid Other | Attending: Emergency Medicine | Admitting: Emergency Medicine

## 2022-11-09 ENCOUNTER — Other Ambulatory Visit: Payer: Self-pay

## 2022-11-09 DIAGNOSIS — R112 Nausea with vomiting, unspecified: Secondary | ICD-10-CM | POA: Insufficient documentation

## 2022-11-09 DIAGNOSIS — R1084 Generalized abdominal pain: Secondary | ICD-10-CM | POA: Insufficient documentation

## 2022-11-09 DIAGNOSIS — R197 Diarrhea, unspecified: Secondary | ICD-10-CM | POA: Diagnosis not present

## 2022-11-09 LAB — URINALYSIS, ROUTINE W REFLEX MICROSCOPIC
Bilirubin Urine: NEGATIVE
Glucose, UA: NEGATIVE mg/dL
Hgb urine dipstick: NEGATIVE
Ketones, ur: NEGATIVE mg/dL
Nitrite: NEGATIVE
Protein, ur: NEGATIVE mg/dL
Specific Gravity, Urine: 1.02 (ref 1.005–1.030)
pH: 7.5 (ref 5.0–8.0)

## 2022-11-09 LAB — COMPREHENSIVE METABOLIC PANEL
ALT: 30 U/L (ref 0–44)
AST: 21 U/L (ref 15–41)
Albumin: 4.5 g/dL (ref 3.5–5.0)
Alkaline Phosphatase: 64 U/L (ref 38–126)
Anion gap: 5 (ref 5–15)
BUN: 13 mg/dL (ref 6–20)
CO2: 29 mmol/L (ref 22–32)
Calcium: 9.5 mg/dL (ref 8.9–10.3)
Chloride: 105 mmol/L (ref 98–111)
Creatinine, Ser: 0.78 mg/dL (ref 0.44–1.00)
GFR, Estimated: >60 mL/min (ref >60)
Glucose, Bld: 79 mg/dL (ref 70–99)
Potassium: 3.6 mmol/L (ref 3.5–5.1)
Sodium: 139 mmol/L (ref 135–145)
Total Bilirubin: 2 mg/dL — ABNORMAL HIGH (ref 0.3–1.2)
Total Protein: 8 g/dL (ref 6.5–8.1)

## 2022-11-09 LAB — CBC
HCT: 43.4 % (ref 36.0–46.0)
Hemoglobin: 14.5 g/dL (ref 12.0–15.0)
MCH: 29.9 pg (ref 26.0–34.0)
MCHC: 33.4 g/dL (ref 30.0–36.0)
MCV: 89.5 fL (ref 80.0–100.0)
Platelets: 269 10*3/uL (ref 150–400)
RBC: 4.85 MIL/uL (ref 3.87–5.11)
RDW: 12.2 % (ref 11.5–15.5)
WBC: 5.8 10*3/uL (ref 4.0–10.5)
nRBC: 0 % (ref 0.0–0.2)

## 2022-11-09 LAB — URINALYSIS, MICROSCOPIC (REFLEX)

## 2022-11-09 LAB — LIPASE, BLOOD: Lipase: 41 U/L (ref 11–51)

## 2022-11-09 LAB — PREGNANCY, URINE: Preg Test, Ur: NEGATIVE

## 2022-11-09 MED ORDER — ONDANSETRON HCL 4 MG/2ML IJ SOLN
4.0000 mg | Freq: Once | INTRAMUSCULAR | Status: AC
  Administered 2022-11-09: 4 mg via INTRAVENOUS
  Filled 2022-11-09: qty 2

## 2022-11-09 MED ORDER — ONDANSETRON 4 MG PO TBDP: 4.0000 mg | ORAL_TABLET | Freq: Three times a day (TID) | ORAL | 0 refills | Status: AC | PRN

## 2022-11-09 MED ORDER — SODIUM CHLORIDE 0.9 % IV BOLUS
1000.0000 mL | Freq: Once | INTRAVENOUS | Status: AC
  Administered 2022-11-09: 1000 mL via INTRAVENOUS

## 2022-11-09 NOTE — ED Triage Notes (Signed)
Patient presents to ED via POV from home. Here with abdominal pain, nausea, vomiting and diarrhea x 4 days.

## 2022-11-09 NOTE — ED Notes (Signed)
Discharge instructions reviewed with patient. Patient verbalizes understanding, no further questions at this time. Medications/prescriptions and follow up information provided. No acute distress noted at time of departure.  

## 2022-11-09 NOTE — Discharge Instructions (Signed)
It was a pleasure taking care of you today.  As discussed, all of your labs are reassuring.  I am sending you home with nausea medication.  Take as needed.  Follow-up with PCP if symptoms do not improve over the next few days.  Return to the ER for new or worsening symptoms.

## 2022-11-09 NOTE — ED Provider Notes (Signed)
[facility] EMERGENCY DEPARTMENT Provider Note   CSN: [id number] Arrival date & time: 11/09/22  1024     History  Chief Complaint  Patient presents with   Abdominal Pain    Kelsey Day is a 36 y.o. female with a past medical history significant for anxiety, history of kidney stone, and history of ovarian cyst who presents to the ED due to generalized abdominal pain associated with nausea, vomiting, and diarrhea.  Patient states symptoms started 3 days ago.  She admits to 1 episode of nonbloody, nonbilious emesis 3 days ago however, none since.  No diarrhea since yesterday. Diarrhea non-bloody. Denies fever and chills.  Patient states her and her son had URI symptoms a few days ago.  Son was seen in the ED yesterday and tested negative for COVID, flu, and RSV.  No vaginal or urinary symptoms.  No previous abdominal operations. Patient also endorses intermittent headaches. History of headaches and notes this feels similar to her typical headaches. No visual changes, speech changes, or unilateral weakness.   History obtained from patient and past medical records. No interpreter used during encounter.       Home Medications Prior to Admission medications   Medication Sig Start Date End Date Taking? Authorizing Provider  ondansetron (ZOFRAN-ODT) 4 MG disintegrating tablet Take 1 tablet (4 mg total) by mouth every 8 (eight) hours as needed for nausea or vomiting. 11/09/22  Yes Suzy Bouchard, PA-C      Allergies    Patient has no known allergies.    Review of Systems   Review of Systems  Constitutional:  Positive for fatigue. Negative for chills and fever.  HENT:  Negative for sore throat.   Respiratory:  Negative for cough and shortness of breath.   Cardiovascular:  Negative for chest pain.  Gastrointestinal:  Positive for abdominal pain, diarrhea, nausea and vomiting.  Genitourinary:  Negative for dysuria and vaginal discharge.  All other systems reviewed and  are negative.   Physical Exam Updated Vital Signs BP 102/70   Pulse 68   Temp 97.9 F (36.6 C) (Oral)   Resp 18   Ht 5\' 6"  (1.676 m)   Wt 65.8 kg   SpO2 99%   BMI 23.40 kg/m  Physical Exam Vitals and nursing note reviewed.  Constitutional:      General: She is not in acute distress.    Appearance: She is not ill-appearing.  HENT:     Head: Normocephalic.  Eyes:     Pupils: Pupils are equal, round, and reactive to light.  Cardiovascular:     Rate and Rhythm: Normal rate and regular rhythm.     Pulses: Normal pulses.     Heart sounds: Normal heart sounds. No murmur heard.    No friction rub. No gallop.  Pulmonary:     Effort: Pulmonary effort is normal.     Breath sounds: Normal breath sounds.  Abdominal:     General: Abdomen is flat. There is no distension.     Palpations: Abdomen is soft.     Tenderness: There is no abdominal tenderness. There is no guarding or rebound.     Comments: Abdomen soft, nondistended, nontender to palpation in all quadrants without guarding or peritoneal signs. No rebound.   Musculoskeletal:        General: Normal range of motion.     Cervical back: Neck supple.  Skin:    General: Skin is warm and dry.  Neurological:     General:  No focal deficit present.     Mental Status: She is alert.  Psychiatric:        Mood and Affect: Mood normal.        Behavior: Behavior normal.     ED Results / Procedures / Treatments   Labs (all labs ordered are listed, but only abnormal results are displayed) Labs Reviewed  COMPREHENSIVE METABOLIC PANEL - Abnormal; Notable for the following components:      Result Value   Total Bilirubin 2.0 (*)    All other components within normal limits  URINALYSIS, ROUTINE W REFLEX MICROSCOPIC - Abnormal; Notable for the following components:   Leukocytes,Ua TRACE (*)    All other components within normal limits  URINALYSIS, MICROSCOPIC (REFLEX) - Abnormal; Notable for the following components:   Bacteria, UA  FEW (*)    All other components within normal limits  LIPASE, BLOOD  CBC  PREGNANCY, URINE    EKG None  Radiology No results found.  Procedures Procedures    Medications Ordered in ED Medications  ondansetron (ZOFRAN) injection 4 mg (4 mg Intravenous Given 11/09/22 1149)  sodium chloride 0.9 % bolus 1,000 mL (0 mLs Intravenous Stopped 11/09/22 1257)    ED Course/ Medical Decision Making/ A&P Clinical Course as of 11/09/22 1311  Mon Nov 09, 2022  1132 Leukocytes,Ua(!): TRACE [CA]  1132 Bacteria, UA(!): FEW [CA]    Clinical Course User Index [CA] Mannie Stabile, PA-C                           Medical Decision Making Amount and/or Complexity of Data Reviewed Labs: ordered. Decision-making details documented in ED Course.  Risk Prescription drug management.   This patient presents to the ED for concern of abdominal pain, this involves an extensive number of treatment options, and is a complaint that carries with it a high risk of complications and morbidity.  The differential diagnosis includes gastroenteritis, appendicitis, acute cholecystitis, bowel obstruction, diverticulitis, viral infection, etc  36 year old female presents to the ED due to abdominal pain associated with nausea, vomiting, diarrhea x 3 days.  Patient and son had URI symptoms a few days ago.  Son tested negative for COVID, flu, and RSV yesterday in the ED.  Patient has not had any vomiting for 3 days and no diarrhea since yesterday.  Patient also admits to a dull, frontal headache.  History of headaches.  Upon arrival, stable vitals.  Patient in no acute distress.  Reassuring physical exam.  Abdomen soft, nondistended, nontender.  Low suspicion for acute abdomen so do not feel CT abdomen is warranted at this time.  Nonfocal neurological exam. Low suspicion for intracranial bleed or other emergent intracranial etiologies. Offered migraine cocktail, patient states she took migraine medication prior to  arrival with improvement in headache.  IV fluids and Zofran given.  Abdominal labs ordered. Patient declined COVID/flu test.   CBC unremarkable.  No leukocytosis.  Normal hemoglobin.  Lipase normal at 41.  Doubt pancreatitis.  Pregnancy test negative.  Doubt ectopic pregnancy.  CMP reassuring.  Normal renal function.  No major electrolyte derangements.  UA significant for trace leukocytes and few bacteria.  No urinary symptoms.  Low suspicion for UTI and pyelonephritis.  Reassessed patient at bedside after IV fluids and Zofran.  Patient admits to improvement in symptoms.  Patient able to tolerate p.o. at bedside.  Patient discharged with Zofran.  Suspect viral etiology.  Abdomen soft, nondistended, nontender.  Suspicion for  acute abdomen.  Advised patient follow-up with PCP symptoms are removed the next few days. Strict ED precautions discussed with patient. Patient states understanding and agrees to plan. Patient discharged home in no acute distress and stable vitals        Final Clinical Impression(s) / ED Diagnoses Final diagnoses:  Nausea vomiting and diarrhea  Generalized abdominal pain    Rx / DC Orders ED Discharge Orders          Ordered    ondansetron (ZOFRAN-ODT) 4 MG disintegrating tablet  Every 8 hours PRN        11/09/22 1305              Suzy Bouchard, PA-C 11/09/22 1313    Cristie Hem, MD 11/09/22 [id number]
